# Patient Record
Sex: Female | Born: 1975 | Race: White | Hispanic: No | Marital: Single | Smoking: Former smoker
Health system: Southern US, Community
[De-identification: ages and names within clinical notes are randomized; demographics above are authoritative.]

## PROBLEM LIST (undated history)

## (undated) DIAGNOSIS — M542 Cervicalgia: Secondary | ICD-10-CM

## (undated) DIAGNOSIS — F32A Depression, unspecified: Secondary | ICD-10-CM

## (undated) DIAGNOSIS — E785 Hyperlipidemia, unspecified: Secondary | ICD-10-CM

## (undated) DIAGNOSIS — K269 Duodenal ulcer, unspecified as acute or chronic, without hemorrhage or perforation: Secondary | ICD-10-CM

## (undated) DIAGNOSIS — K922 Gastrointestinal hemorrhage, unspecified: Secondary | ICD-10-CM

## (undated) HISTORY — DX: Hyperlipidemia, unspecified: E78.5

## (undated) HISTORY — DX: Depression, unspecified: F32.A

## (undated) HISTORY — DX: Gastrointestinal hemorrhage, unspecified: K92.2

## (undated) HISTORY — PX: ABDOMINAL HYSTERECTOMY: SHX81

## (undated) HISTORY — PX: CHOLECYSTECTOMY: SHX55

## (undated) HISTORY — DX: Duodenal ulcer, unspecified as acute or chronic, without hemorrhage or perforation: K26.9

---

## 2004-09-15 ENCOUNTER — Ambulatory Visit: Payer: Self-pay | Admitting: Family Medicine

## 2004-11-13 ENCOUNTER — Inpatient Hospital Stay (HOSPITAL_COMMUNITY): Admission: AD | Admit: 2004-11-13 | Discharge: 2004-11-13 | Payer: Self-pay | Admitting: Obstetrics & Gynecology

## 2004-11-28 ENCOUNTER — Ambulatory Visit: Payer: Self-pay | Admitting: *Deleted

## 2004-12-18 ENCOUNTER — Emergency Department (HOSPITAL_COMMUNITY): Admission: EM | Admit: 2004-12-18 | Discharge: 2004-12-18 | Payer: Self-pay | Admitting: Emergency Medicine

## 2004-12-26 ENCOUNTER — Ambulatory Visit: Payer: Self-pay | Admitting: Obstetrics and Gynecology

## 2006-06-25 IMAGING — CT CT HEAD W/O CM
1 series · 16 of 30 positions shown, 20 images · IV contrast (agent unspecified)
Comparison: None.

CLINICAL DATA: Headaches since [REDACTED] night.  Brief loss of consciousness.   Back of head and neck hurts.
 HEAD CT WITHOUT CONTRAST:
TECHNIQUE: Contiguous axial CT images were obtained from the base of the skull through the vertex according to standard protocol without contrast.

[Series 2: brain w/o 4.8 h45s st · axial · non-contrast · 0.41mm/px · z∈[-163,-32]mm · 16 of 30 slices shown, 20 images]
[im 2/30  brain]
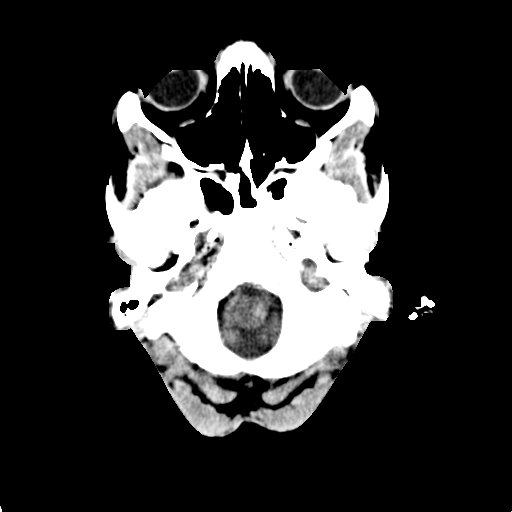
[im 2/30  bone]
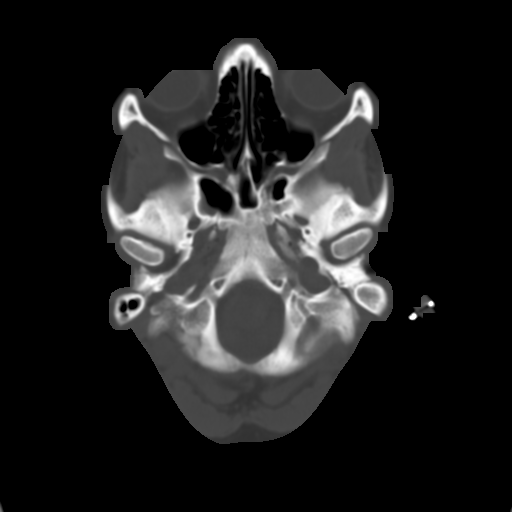
[im 4/30  brain]
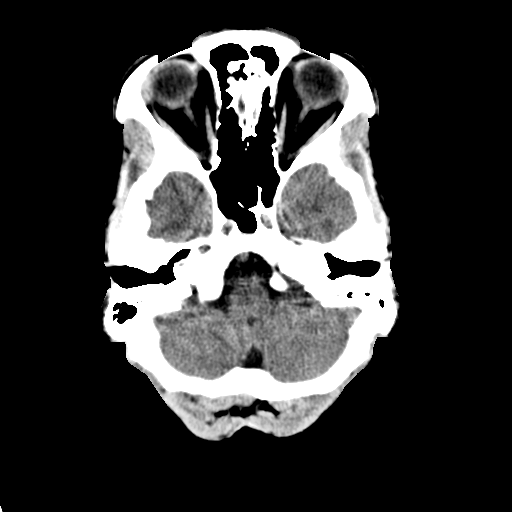
[im 6/30  brain]
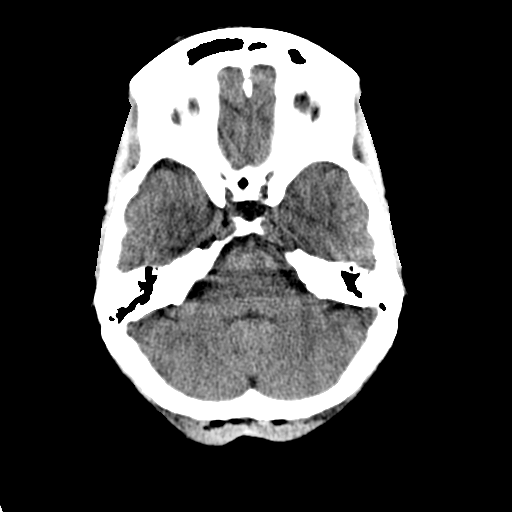
[im 8/30  brain]
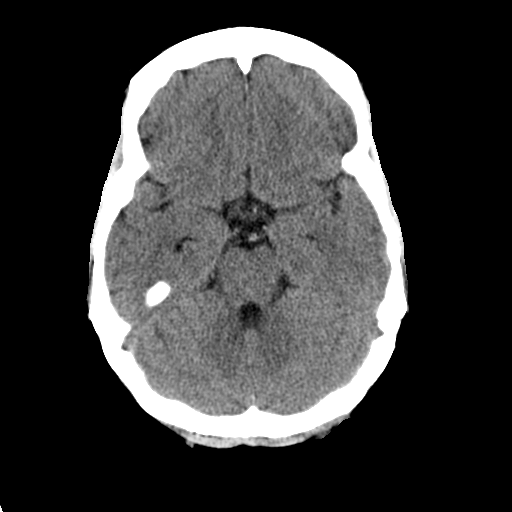
[im 9/30  brain]
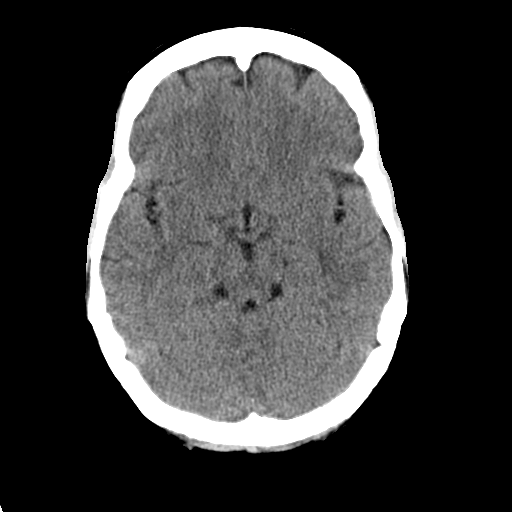
[im 9/30  bone]
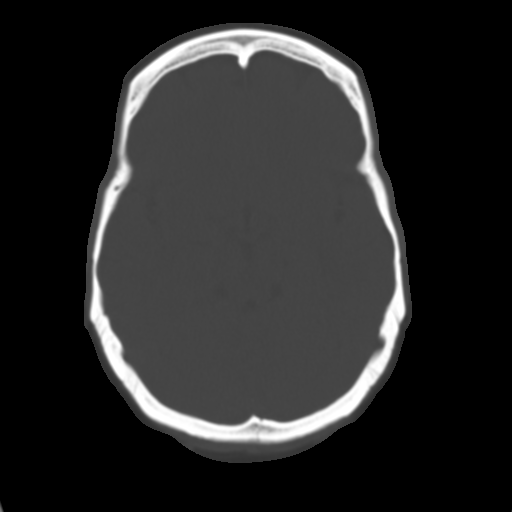
[im 11/30  brain]
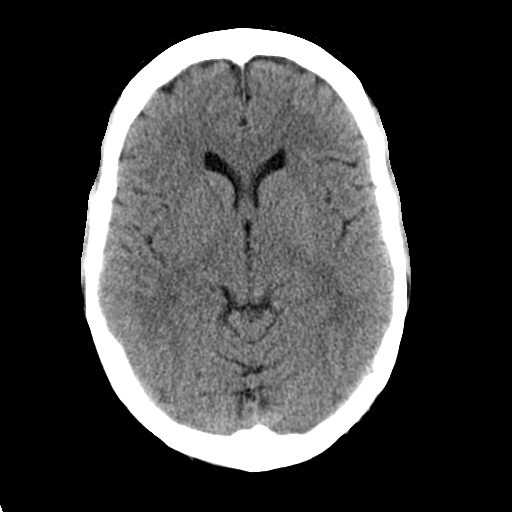
[im 13/30  brain]
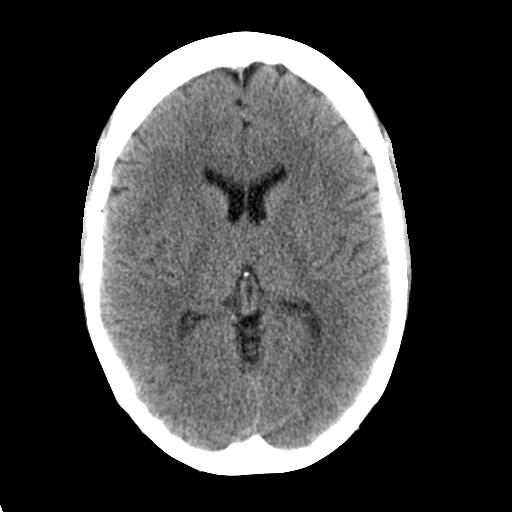
[im 15/30  brain]
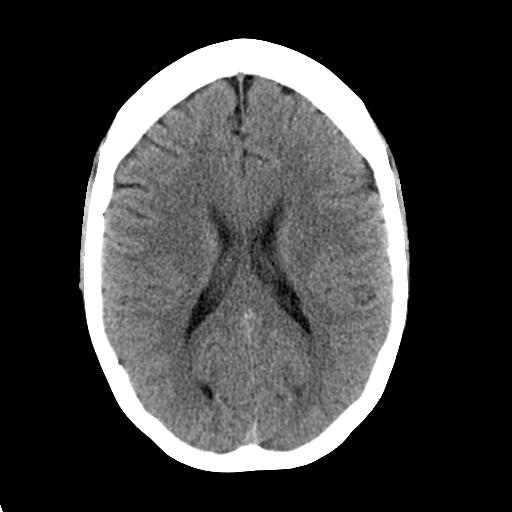
[im 16/30  brain]
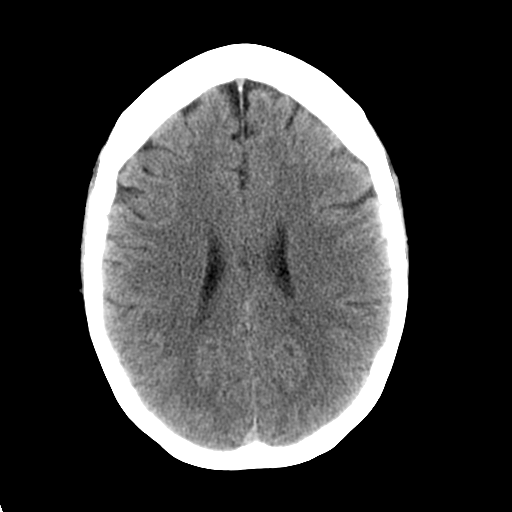
[im 16/30  bone]
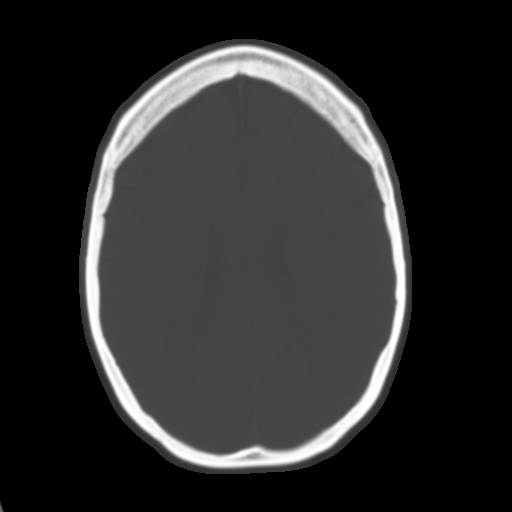
[im 18/30  brain]
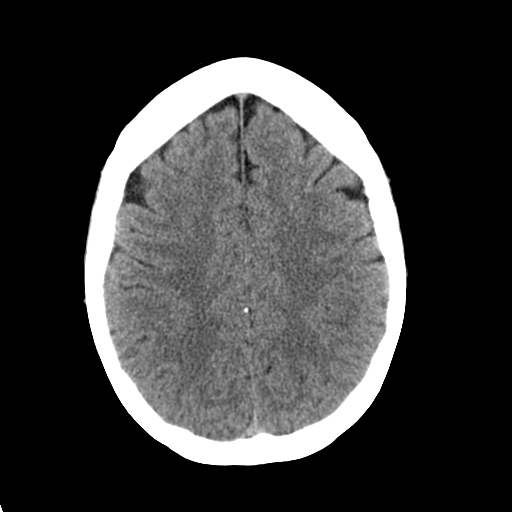
[im 20/30  brain]
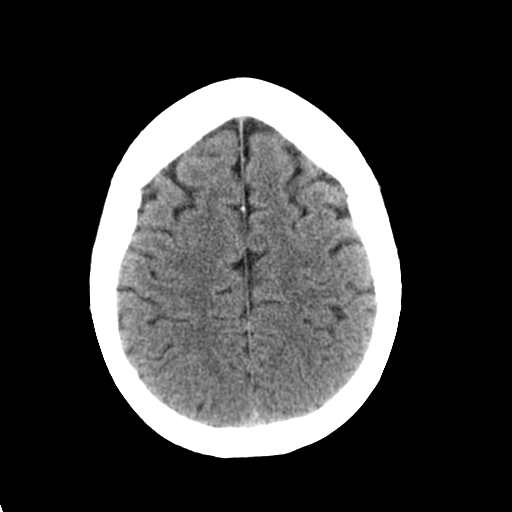
[im 22/30  brain]
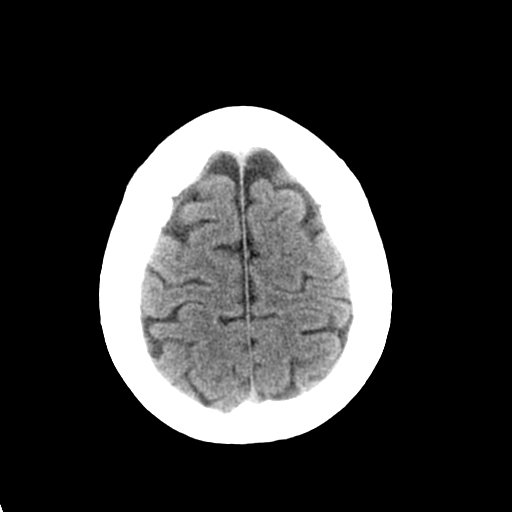
[im 23/30  brain]
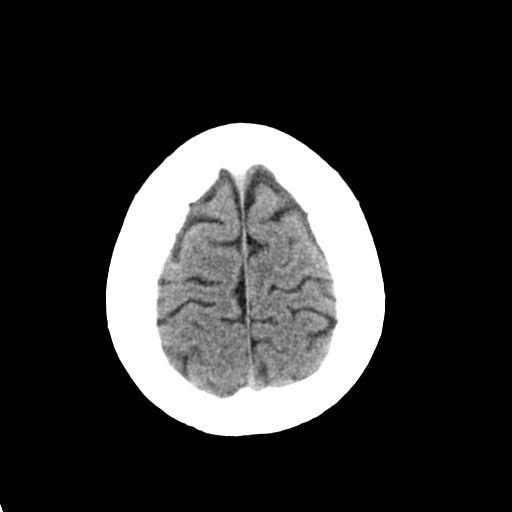
[im 23/30  bone]
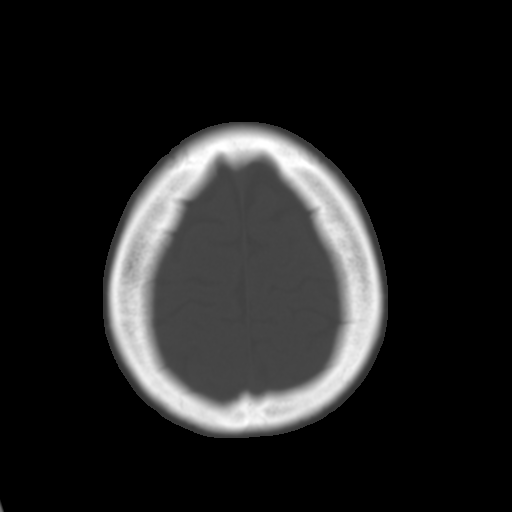
[im 25/30  brain]
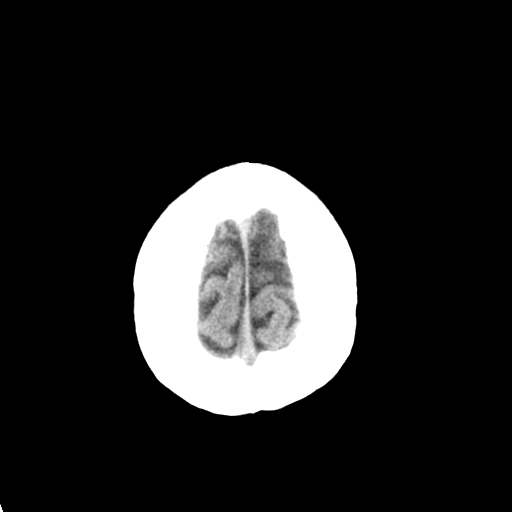
[im 27/30  brain]
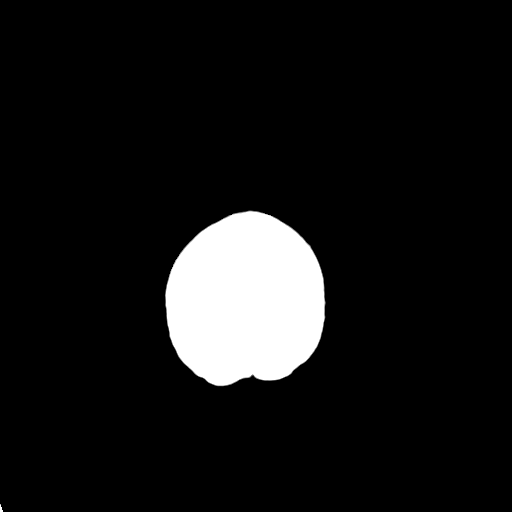
[im 29/30  brain]
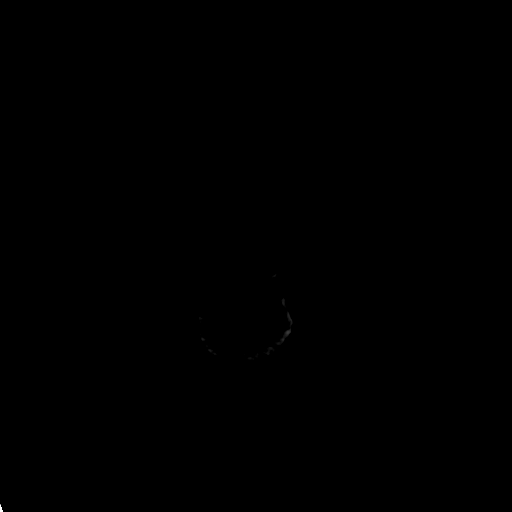

[16 of 30 positions shown; findings below may reference images not displayed]

FINDINGS: There is no evidence of intracranial hemorrhage, brain edema, or mass effect.  No other intra-axial abnormalities are seen, and the ventricles are within normal limits.  No abnormal extra-axial fluid collections or masses are identified.  No skull abnormalities are noted.
IMPRESSION: Negative non-contrast head CT.

## 2015-03-13 ENCOUNTER — Encounter (HOSPITAL_COMMUNITY): Payer: Self-pay | Admitting: Emergency Medicine

## 2015-03-13 ENCOUNTER — Emergency Department (HOSPITAL_COMMUNITY)
Admission: EM | Admit: 2015-03-13 | Discharge: 2015-03-13 | Disposition: A | Discharge status: Home / Self Care | Payer: BLUE CROSS/BLUE SHIELD | Attending: Emergency Medicine | Admitting: Emergency Medicine

## 2015-03-13 DIAGNOSIS — M545 Low back pain: Secondary | ICD-10-CM | POA: Diagnosis present

## 2015-03-13 DIAGNOSIS — Z79899 Other long term (current) drug therapy: Secondary | ICD-10-CM | POA: Insufficient documentation

## 2015-03-13 DIAGNOSIS — Z87891 Personal history of nicotine dependence: Secondary | ICD-10-CM | POA: Diagnosis not present

## 2015-03-13 DIAGNOSIS — Z880 Allergy status to penicillin: Secondary | ICD-10-CM | POA: Insufficient documentation

## 2015-03-13 DIAGNOSIS — M5441 Lumbago with sciatica, right side: Secondary | ICD-10-CM | POA: Insufficient documentation

## 2015-03-13 MED ORDER — TRAMADOL HCL 50 MG PO TABS
50.0000 mg | ORAL_TABLET | Freq: Four times a day (QID) | ORAL | Status: DC | PRN
Start: 2015-03-13 — End: 2022-07-14

## 2015-03-13 MED ORDER — HYDROMORPHONE HCL 1 MG/ML IJ SOLN
1.0000 mg | Freq: Once | INTRAMUSCULAR | Status: AC
  Administered 2015-03-13: 1 mg via INTRAMUSCULAR
  Filled 2015-03-13: qty 1

## 2015-03-13 MED ORDER — CYCLOBENZAPRINE HCL 10 MG PO TABS: 10.0000 mg | ORAL_TABLET | Freq: Two times a day (BID) | ORAL | Status: DC | PRN

## 2015-03-13 NOTE — Discharge Instructions (Signed)
Please read attached information. If you experience any new or worsening signs or symptoms please return to the emergency room for evaluation. Please follow-up with your primary care provider or specialist as discussed. Please use medication prescribed only as directed and discontinue taking if you have any concerning signs or symptoms.   °

## 2015-03-13 NOTE — ED Notes (Signed)
Per EMS, patient started having severe right hip, leg, and lower back pian around 11pm. Woke around 4 to void, pain intolerable. Hx of sciatic nerve pain, but on left side, not right side. Pt took a goody powder around 4 with no relief. VS 112/60, 98% on room air, 75 pulse, cbg 90. Patient tearful on arrival.

## 2015-03-13 NOTE — ED Provider Notes (Signed)
CSN: [id number]     Arrival date & time 03/13/15  0553 History   First MD Initiated Contact with Patient 03/13/15 [phone]     Chief Complaint  Patient presents with  . Hip Pain   HPI   40 year old female presents today with right back and hip pain. Patient reports symptoms started around 4 AM this morning with pain to the posterior aspect of her hip and lower back. She reports the pain originally was only minor, relieved with positioning of her foot and rotation of the lower extremity. She reports symptoms progressed with typical sciatic pain. She reports she's had sciatic pain to the left hip but never to the right. She reports the pain was shooting down her leg with 4 flexion and different movements of the hip. She reports she was unable to find a comfortable position, and BC powders at home did not improve symptoms. Patient denies any fever, chills, abdominal pain, nausea, vomiting, changes in the color clarity or characteristics of her urine, she denies any lower extremity swelling or edema, loss of distal sensation strength or motor function. Patient reports she's had difficulty ambulating due to the pain, but denies any specific loss of strength. Patient reports she's been seen by primary care for her sciatic symptoms of the left side, and has lost 30 pounds 2 help with lower back pain.  History reviewed. No pertinent past medical history. Past Surgical History  Procedure Laterality Date  . Abdominal hysterectomy      full hysterectomy  . Cholecystectomy     History reviewed. No pertinent family history. Social History  Substance Use Topics  . Smoking status: Former Smoker    Types: Cigarettes    Quit date: 03/12/2010  . Smokeless tobacco: None  . Alcohol Use: Yes     Comment: occasional drinker   OB History    No data available     Review of Systems  All other systems reviewed and are negative.  Allergies  Penicillins  Home Medications   Prior to Admission medications    Medication Sig Start Date End Date Taking? Authorizing Provider  Biotin 300 MCG TABS Take 300 mcg by mouth daily.   Yes Historical Provider, MD  Cholecalciferol (VITAMIN D3) 5000 units CAPS Take 5,000 Units by mouth daily.   Yes Historical Provider, MD  Cyanocobalamin (VITAMIN B-12 PO) Take 1 tablet by mouth daily.   Yes Historical Provider, MD  cyclobenzaprine (FLEXERIL) 10 MG tablet Take 1 tablet (10 mg total) by mouth 2 (two) times daily as needed for muscle spasms. 03/13/15   Eyvonne Mechanic, PA-C  traMADol (ULTRAM) 50 MG tablet Take 1 tablet (50 mg total) by mouth every 6 (six) hours as needed. 03/13/15   Virgilia Quigg, PA-C   BP 113/68 mmHg  Pulse 72  Resp 16  Ht 5\' 2"  (1.575 m)  Wt 85.73 kg  BMI 34.56 kg/m2  SpO2 99%   Physical Exam  Constitutional: She is oriented to person, place, and time. She appears well-developed and well-nourished. No distress.  HENT:  Head: Normocephalic and atraumatic.  Eyes: Conjunctivae are normal. Pupils are equal, round, and reactive to light. Right eye exhibits no discharge. Left eye exhibits no discharge. No scleral icterus.  Neck: Normal range of motion. Neck supple. No JVD present. No tracheal deviation present.  Pulmonary/Chest: Effort normal. No stridor.  Musculoskeletal: Normal range of motion. She exhibits tenderness. She exhibits no edema.  No C, T, or L spine tenderness to palpation. No obvious signs of  trauma, deformity, infection, step-offs. Lung expansion normal. No scoliosis or kyphosis. Bilateral lower extremity strength 5 out of 5, sensation grossly intact, patellar reflexes 2+, pedal pulses 2+, Refill less than 3 seconds.  Exquisite tenderness to palpation of the right lower lumbar soft tissue and posterior hip. Full active range of motion of the hip with pain to deep flexion, no warmth to touch.  Straight leg negative  Ambulates with    Neurological: She is alert and oriented to person, place, and time. Coordination normal.  Skin:  Skin is warm and dry. She is not diaphoretic.  Psychiatric: She has a normal mood and affect. Her behavior is normal. Judgment and thought content normal.  Nursing note and vitals reviewed.     ED Course  Procedures (including critical care time) Labs Review Labs Reviewed - No data to display  Imaging Review No results found. I have personally reviewed and evaluated these images and lab results as part of my medical decision-making.   EKG Interpretation None      MDM   Final diagnoses:  Right-sided low back pain with right-sided sciatica   Labs:   Imaging:  Consults:  Therapeutics: Dilaudid  Discharge Meds: Tramadol, Flexeril  Assessment/Plan: 40-year-old female presents today with likely musculoskeletal back and hip pain. Patient has a history of the same on the left, has had increased activity recently, with no red flags. She has no neurological deficits, or any other concerning signs or symptoms that would necessitate further evaluation and management here in the evening. Patient pain was managed here in the ED, patient was discharged home with pain medication and instructions follow-up with a primary care provider in one week.          Eyvonne MechanicJeffrey Talia Hoheisel, PA-C 03/13/15 1422  Tomasita CrumbleAdeleke Oni, MD 03/13/15 [phone]

## 2016-03-02 ENCOUNTER — Encounter (HOSPITAL_COMMUNITY): Payer: Self-pay | Admitting: *Deleted

## 2016-03-02 ENCOUNTER — Emergency Department (HOSPITAL_COMMUNITY)
Admission: EM | Admit: 2016-03-02 | Discharge: 2016-03-02 | Disposition: A | Discharge status: Home / Self Care | Payer: BLUE CROSS/BLUE SHIELD | Attending: Emergency Medicine | Admitting: Emergency Medicine

## 2016-03-02 ENCOUNTER — Emergency Department (HOSPITAL_COMMUNITY): Payer: BLUE CROSS/BLUE SHIELD

## 2016-03-02 DIAGNOSIS — Z87891 Personal history of nicotine dependence: Secondary | ICD-10-CM | POA: Diagnosis not present

## 2016-03-02 DIAGNOSIS — N200 Calculus of kidney: Secondary | ICD-10-CM | POA: Diagnosis not present

## 2016-03-02 DIAGNOSIS — R109 Unspecified abdominal pain: Secondary | ICD-10-CM | POA: Diagnosis present

## 2016-03-02 LAB — URINALYSIS, ROUTINE W REFLEX MICROSCOPIC
Bilirubin Urine: NEGATIVE
Glucose, UA: NEGATIVE mg/dL
Ketones, ur: NEGATIVE mg/dL
Nitrite: NEGATIVE
Protein, ur: NEGATIVE mg/dL
Specific Gravity, Urine: 1.006 (ref 1.005–1.030)
pH: 6 (ref 5.0–8.0)

## 2016-03-02 LAB — LIPASE, BLOOD: Lipase: 17 U/L (ref 11–51)

## 2016-03-02 LAB — CBC WITH DIFFERENTIAL/PLATELET
Basophils Absolute: 0 10*3/uL (ref 0.0–0.1)
Basophils Relative: 0 %
Eosinophils Absolute: 0.1 10*3/uL (ref 0.0–0.7)
Eosinophils Relative: 2 %
HCT: 39.5 % (ref 36.0–46.0)
Hemoglobin: 13.2 g/dL (ref 12.0–15.0)
Lymphocytes Relative: 31 %
Lymphs Abs: 2.2 10*3/uL (ref 0.7–4.0)
MCH: 30.1 pg (ref 26.0–34.0)
MCHC: 33.4 g/dL (ref 30.0–36.0)
MCV: 90.2 fL (ref 78.0–100.0)
Monocytes Absolute: 0.4 10*3/uL (ref 0.1–1.0)
Monocytes Relative: 5 %
Neutro Abs: 4.5 10*3/uL (ref 1.7–7.7)
Neutrophils Relative %: 62 %
Platelets: 277 10*3/uL (ref 150–400)
RBC: 4.38 MIL/uL (ref 3.87–5.11)
RDW: 13.5 % (ref 11.5–15.5)
WBC: 7.2 10*3/uL (ref 4.0–10.5)

## 2016-03-02 LAB — COMPREHENSIVE METABOLIC PANEL
ALT: 19 U/L (ref 14–54)
AST: 17 U/L (ref 15–41)
Albumin: 3.9 g/dL (ref 3.5–5.0)
Alkaline Phosphatase: 69 U/L (ref 38–126)
Anion gap: 10 (ref 5–15)
BUN: 15 mg/dL (ref 6–20)
CO2: 21 mmol/L — ABNORMAL LOW (ref 22–32)
Calcium: 9.3 mg/dL (ref 8.9–10.3)
Chloride: 107 mmol/L (ref 101–111)
Creatinine, Ser: 0.94 mg/dL (ref 0.44–1.00)
GFR calc Af Amer: >60 mL/min (ref >60)
GFR calc non Af Amer: >60 mL/min (ref >60)
Glucose, Bld: 85 mg/dL (ref 65–99)
Potassium: 4.1 mmol/L (ref 3.5–5.1)
Sodium: 138 mmol/L (ref 135–145)
Total Bilirubin: 0.7 mg/dL (ref 0.3–1.2)
Total Protein: 6.8 g/dL (ref 6.5–8.1)

## 2016-03-02 MED ORDER — KETOROLAC TROMETHAMINE 30 MG/ML IJ SOLN
30.0000 mg | Freq: Once | INTRAMUSCULAR | Status: AC
  Administered 2016-03-02: 30 mg via INTRAVENOUS
  Filled 2016-03-02: qty 1

## 2016-03-02 MED ORDER — ONDANSETRON HCL 4 MG/2ML IJ SOLN
4.0000 mg | Freq: Once | INTRAMUSCULAR | Status: AC
  Administered 2016-03-02: 4 mg via INTRAVENOUS
  Filled 2016-03-02: qty 2

## 2016-03-02 MED ORDER — SODIUM CHLORIDE 0.9 % IV SOLN
Freq: Once | INTRAVENOUS | Status: AC
  Administered 2016-03-02: 11:00:00 via INTRAVENOUS

## 2016-03-02 NOTE — ED Triage Notes (Signed)
To ED for eval of llq pain since this am. States the pain is sharp and after it started she had 1 episode of diarrhea. No vomiting. No fever. No pain with urination

## 2016-03-02 NOTE — ED Provider Notes (Signed)
[facility]-EMERGENCY DEPT Provider Note   CSN: [id number] Arrival date & time: 03/02/16  1006     History   Chief Complaint Chief Complaint  Patient presents with  . Abdominal Pain    HPI Megan Melton is a 40 y.o. female.  HPI Megan Melton is a 40 y.o. female with history of full abdominal hysterectomy and cholecystectomy, presents to emergency department with sudden onset of sharp left-sided abdominal pain. Patient states pain started this morning when she was sitting at her desk, states approximately hour and a half ago. She describes initial pain is sharp, shooting from the flank into the left lower abdomen. She states she felt full and bloated at the same time. She thought it was gas pain and needed to have a bowel movement. She reports associated nausea and diaphoresis. She states when she went to the bathroom she had small diarrhea which is not unusual for her. She reports no pain relief after the bowel movement. She states at this time the sharp pain has resolved, but continues to have dull pain in the left lower abdomen only radiating into "my private area." Denies any urinary symptoms. Did not take any medications prior to coming in.  History reviewed. No pertinent past medical history.  There are no active problems to display for this patient.   Past Surgical History:  Procedure Laterality Date  . ABDOMINAL HYSTERECTOMY     full hysterectomy  . CHOLECYSTECTOMY      OB History    No data available       Home Medications    Prior to Admission medications   Medication Sig Start Date End Date Taking? Authorizing Provider  Biotin 300 MCG TABS Take 300 mcg by mouth daily.    Historical Provider, MD  Cholecalciferol (VITAMIN D3) 5000 units CAPS Take 5,000 Units by mouth daily.    Historical Provider, MD  Cyanocobalamin (VITAMIN B-12 PO) Take 1 tablet by mouth daily.    Historical Provider, MD  cyclobenzaprine (FLEXERIL) 10 MG tablet Take 1 tablet (10 mg total) by mouth 2  (two) times daily as needed for muscle spasms. 03/13/15   Eyvonne MechanicJeffrey Hedges, PA-C  traMADol (ULTRAM) 50 MG tablet Take 1 tablet (50 mg total) by mouth every 6 (six) hours as needed. 03/13/15   Eyvonne MechanicJeffrey Hedges, PA-C    Family History No family history on file.  Social History Social History  Substance Use Topics  . Smoking status: Former Smoker    Types: Cigarettes    Quit date: 03/12/2010  . Smokeless tobacco: Not on file  . Alcohol use Yes     Comment: occasional drinker     Allergies   Penicillins   Review of Systems Review of Systems  Constitutional: Negative for chills and fever.  Respiratory: Negative for cough, chest tightness and shortness of breath.   Cardiovascular: Negative for chest pain, palpitations and leg swelling.  Gastrointestinal: Positive for abdominal pain and nausea. Negative for diarrhea and vomiting.  Genitourinary: Negative for dysuria, flank pain and pelvic pain.  Musculoskeletal: Negative for arthralgias, myalgias, neck pain and neck stiffness.  Skin: Negative for rash.  Neurological: Negative for dizziness, weakness and headaches.  All other systems reviewed and are negative.    Physical Exam Updated Vital Signs BP 144/83 (BP Location: Left Arm)   Pulse 66   Temp 97.4 F (36.3 C) (Oral)   Ht 5\' 2"  (1.575 m)   Wt 86.2 kg   SpO2 99%   BMI 34.75 kg/m  Physical Exam  Constitutional: She is oriented to person, place, and time. She appears well-developed and well-nourished. No distress.  HENT:  Head: Normocephalic.  Eyes: Conjunctivae are normal.  Neck: Neck supple.  Cardiovascular: Normal rate, regular rhythm and normal heart sounds.   Pulmonary/Chest: Effort normal and breath sounds normal. No respiratory distress. She has no wheezes. She has no rales.  Abdominal: Soft. Bowel sounds are normal. She exhibits no distension. There is tenderness. There is no rebound.  Musculoskeletal: She exhibits no edema.  Neurological: She is alert and oriented  to person, place, and time.  Skin: Skin is warm and dry.  Psychiatric: She has a normal mood and affect. Her behavior is normal.  Nursing note and vitals reviewed.    ED Treatments / Results  Labs (all labs ordered are listed, but only abnormal results are displayed) Labs Reviewed  COMPREHENSIVE METABOLIC PANEL - Abnormal; Notable for the following:       Result Value   CO2 21 (*)    All other components within normal limits  URINALYSIS, ROUTINE W REFLEX MICROSCOPIC - Abnormal; Notable for the following:    Color, Urine STRAW (*)    Hgb urine dipstick SMALL (*)    Leukocytes, UA SMALL (*)    Bacteria, UA RARE (*)    Squamous Epithelial / LPF 0-5 (*)    All other components within normal limits  CBC WITH DIFFERENTIAL/PLATELET  LIPASE, BLOOD    EKG  EKG Interpretation None       Radiology Ct Renal Stone Study  Result Date: 03/02/2016 CLINICAL DATA:  Left flank pain radiating to the left lower quadrant this morning. EXAM: CT ABDOMEN AND PELVIS WITHOUT CONTRAST TECHNIQUE: Multidetector CT imaging of the abdomen and pelvis was performed following the standard protocol without IV contrast. COMPARISON:  None. FINDINGS: Lower chest: No acute abnormality. Hepatobiliary: No focal liver abnormality is seen. Status post cholecystectomy. No biliary dilatation. Pancreas: Unremarkable. No pancreatic ductal dilatation or surrounding inflammatory changes. Spleen: Normal in size without focal abnormality. Adrenals/Urinary Tract: The adrenal glands are normal. There is a 1 mm nonobstructing stone in the lateral midpole right kidney. Bilateral parapelvic renal cysts are identified. There is no hydronephrosis bilaterally. There is a 2 mm stone in the posterior bladder lumen. The bladder is otherwise normal. Stomach/Bowel: Stomach is within normal limits. Appendix appears normal. No evidence of bowel wall thickening, distention, or inflammatory changes. Vascular/Lymphatic: No significant vascular  findings are present. No enlarged abdominal or pelvic lymph nodes. Reproductive: Status post hysterectomy. No adnexal masses. Other: There is minimal umbilical herniation of mesenteric fat. Musculoskeletal: No acute or significant osseous findings. IMPRESSION: A 2 mm stone in the posterior bladder lumen. This could represent a recently passed stone. There is no hydronephrosis bilaterally. Small nonobstructing stone is identified in the right kidney. Electronically Signed   By: Sherian Rein M.D.   On: 03/02/2016 11:31    Procedures Procedures (including critical care time)  Medications Ordered in ED Medications  ondansetron (ZOFRAN) injection 4 mg (4 mg Intravenous Given 03/02/16 1100)  ketorolac (TORADOL) 30 MG/ML injection 30 mg (30 mg Intravenous Given 03/02/16 1101)  0.9 %  sodium chloride infusion ( Intravenous Stopped 03/02/16 1249)     Initial Impression / Assessment and Plan / ED Course  I have reviewed the triage vital signs and the nursing notes.  Pertinent labs & imaging results that were available during my care of the patient were reviewed by me and considered in my medical decision making (  see chart for details).  Clinical Course    Patient with left lower quadrant abdominal pain shooting into the flank and into the vaginal area onset suddenly while at work aren't half ago. Highly suspicious for kidney stone, although I cannot rule out diverticulitis or colitis given an episode of diarrhea. We will check labs, will get CT abdomen and pelvis. Patient is very anxious, states that her sister passed away from what they thought was pancreatitis but states she ended up having an MI. Patient denies any chest pain or shortness of breath at this time. Patient reassured.  Labs normal. Patient's CT scan shows 2 mm stone in the bladder. Consistent with a passed stone. Patient feels much better. Discussed results and plan of discharge home and constipation follow-up. Return precautions  discussed. Patient is comfortable with the plan, voiced understanding, all questions answered.  Vitals:   03/02/16 1015 03/02/16 1230  BP: 144/83 116/79  Pulse: 66 66  Resp:  16  Temp: 97.4 F (36.3 C)   TempSrc: Oral   SpO2: 99% 100%  Weight: 86.2 kg   Height:  (1.575 m)      Final Clinical Impressions(s) / ED Diagnoses   Final diagnoses:  Kidney stone    New Prescriptions Discharge Medication List as of 03/02/2016 12:34 PM       Jaynie Crumble, PA-C 03/02/16 1533    Maia Plan, MD 03/02/16 1842

## 2016-03-02 NOTE — Discharge Instructions (Signed)
Take ibuprofen or tylenol for pain. Your lab work is normal today. Your CT scan showed that you passed a 7[place]6Romeo Marye R32w0[facility] Thedore KoreaMMemphis Eye And Cataract Ambulatory Surgery CenteriKoreanRomKentuckyeoCathren HaRegency Hospital Of Toledor4782767w18(709)36w61KeRedg440[place]53DAvera Creighton HoMaryl51045KentuckyKentucky57923KentuckymRomeo Marye R32w0HiLLCrest HospThedore KoreaMSt. Luke'S Wood River Medical CenteriKoreanRomKentuckyeoCathren HaRanken Jordan A Pediatric Rehabilitation Centerr4782723w1852062Redg785Kentucky19DLourdes Counseling CentMaryl21045KentuckyKentucky34939KentuckymRomeo Marye R81w0Blue Bonnet SurgerThedore KoreaMPali Momi Medical CenteriKoreanRomKentuckyeoCathren HaRincon Medical Centerr4782758Redg813Kentucky72DMid Florida Endoscopy And SurgeMaryl70045KentuckyKentucky65926KentuckymRomeo Marye R20w0[place] SurgicaThedore KoreaMMetropolitan St. Louis Psychiatric CenteriKoreanRomKentuckyeoCathren HaBaptist Eastpoint Surgery Center LLCr4782773w18(26772w61KeRedg(361Kentucky32DSouth Florida State HospitMaryl16045KentuckyKentucky1194KentuckymRomeo Marye R25w0Girard MediThedore KoreaMEisenhower Army Medical CenteriKoreanRomKentuckyeoCathren HaMilford Valley Memorial Hospitalr478276Redg254Kentucky66DCenterstoneMaryl71045KentuckyKentucky38929KentuckymRomeo Marye R23w0Barnes-Kasson CountThedore KoreaMProvidence Medford Medical CenteriKoreanRomKentuckyeoCathren HaParkview Community Hospital Medical Centerr47827Redg743Kentucky38DEndoscopy Center Of BuckMaryl72045KentuckyKentucky37988KentuckymRomeo Marye R39w0Sanford HospitThedore KoreaMFolsom Outpatient Surgery Center LP Dba Folsom Surgery CenteriKoreanRomKentuckyeoCathren HaAllen County Regional Hospitalr4782Redg95Kentucky46DEastern NiagMaryl19045KentuckyKentucky44934KentuckymRomeo Marye R71w0Blair Endoscopy Thedore KoreaMProvidence St. Peter HospitaliKoreanRomKentuckyeoCathren HaHershey Endoscopy Center LLCr4782781w1Redg941Kentucky53DSurgical Associates EndoscopyMaryl8104KentuckyKentucky5991KentuckymRomeo Marye R55w0St. Vincent'Thedore KoreaMKu Medwest Ambulatory Surgery Center LLCiKoreanRomKentuckyeoCathren HaSpine And Sports Surgical Center LLCRedg223Kentucky68DVa N California HealMaryl39045KentuckyKentucky5898KentuckymRomeo Marye R68w0Windom AreThedore KoreaMNorth Valley Health CenteriKoreanRomKentuckyeoCathren HaAdventhealth Maryl10977924KentuckymRomeo Marye R65w0Nacogdoches MemoriaThedore KoreaMWyoming Endoscopy CenteriKoreanRomKentuckyeoCathren HaMclaren Bay Regionalr4782749w18Redg(984)[place]ucky24DBurbank Spine And Pain SurgeryMaryl38045KentuckyKentucky46960KentuckymRomeo Marye R85w0Weimar MediThedore KoreaMPalestine Laser And Surgery CenteriKoreanRomKentuckyeoCathren HaSt Dominic Ambulatory Surgery Centerr4782782wRedg(817Kentucky14DGreat Plains Regional MedicalMaryl66045KentuckyKentucky60937KentuckymRomeo Marye R61w0Ottawa County HeaThedore KoreaMMain Street Asc LLCiKoreanRomKentuckyeoCathren HaBanner - University Medical Center Phoenix Campusr478Redg8Kentucky75DSwedish Medical Center - BallaMaryl45045KentuckyKentucky60961KentuckymRomeo Marye R64Thedore KoreaMAltru Specialty HospitaKentuckyliCathren HaMemorial Hospitalr473576931KentuckyZOXWChKentucKorea20UUVO3674Shadelands Advanced EndPatria MKentuc502Kentucky55DTri City Surgery Center LLConLuGretcFalkland Islands 19(MSynetta FA0KentuckyKentucky436KentuckymRomeo Marye R9w0T Surgery Thedore KoreaMAllegheny Clinic Dba Ahn Westmoreland Endoscopy CenteriKoreanRomKentuckyeoCathren HaMemorial Hermann Surgery Center Pinecroftr4782731w1Redg718Kentucky57DAnmed Health RehabilitaMaryl69045KentuckyKentucky39948KentuckymRomeo Marye R36w0Prisma Health HiLLCresThedore KoreaMAmbulatory Endoscopic Surgical Center Of [place]ucks [place] LLCiKoreanRomKentuckyeoCathren HaAnimas Surgical Hospital, LLCr4782742w18(97034w61KentuckyZOXWChKentucKoreaRedg269[place]entucky30DWest Springs HMaryl45045KentuckyKentucky93977KentuckymRomeo Marye R5w0River Valley MediThedore KoreaMSt Joseph HospitaliKoreanRomKentuckyeoCathren HaSouthern Tennessee Regional Health System Lawrenceburgr4782716w188Redg332Kentucky16DNorth Arkansas Regional Medical Centerone7AcaMaryl70045KentuckyKentucky47974KentuckymRomeo Marye R78w0Institute Of Orthopaedic SThedore KoreaMMercy HospitaliKoreanRomKentuckyeoCathren HaOak And Main Surgicenter LLCrRedg2Kentucky23DSutter Amador SurgMaryl10004KentuckyKentucky52947KentuckymRomeo Marye R28w0George RegionaThedore KoreaMCompass Behavioral Center Of AlexandriaiKoreanRomKentuckyeoCathren HaFair Park Surgery Centerr478272w1Redg574Kentucky63DIron County Hospitalone8GastrMaryl200451KentuckyKentucky06938KentuckymRomeo Marye R48w0Mid PeninsulaThedore KoreaMWallowa Memorial HospitaliKoreanRomKentuckyeoCathren HaCenter For Ambulatory And Minimally Invasive Surgery LLCr4782761wRedg865Kentucky85DSaint ALPhonsus Medical Center - OntarioMaryl82045KentuckyKentucky50936KentuckymRomeo Marye R55w0Newport BaThedore KoreaMRimrock FoundationiKoreanRomKentuckyeoCathren HaCoastal Surgery Center LLCr478278Redg779Kentucky75DTristar Skyline MedicalMaryl19045KentuckyKentucky65940KentuckymRomeo Marye R107w0Memorial HospiThedore KoreaMMankato Clinic Endoscopy Center LLCiKoreanRomKentuckyeoCathren HaSouthern Endoscopy Suite LLCr4782725w1Redg941Kentucky71DCaribbean Medical Centerone(33ContinuMaryl6045KentuckyKentucky12964KentuckymRomeo Marye R29w0Saint VincenThedore KoreaMCheyenne Surgical Center LLCiKoreanRomKentuckyeoCathren HaVa Medical Center - Bataviar47Redg(364Kentucky21DUcsf Medical CenteMaryl38045KentuckyKentucky74922KentuckymRomeo Marye R24w0Wake Forest Joint VeThedore KoreaMJenkins County HospitaliKoreanRomKentuckyeoCathren HaHind General Hospital LLCr4782763w18638w61KeRedg(316)[place]ucky27DPinnacle Regional HoMaryl22045KentuckyKentucky59957KentuckymRomeo Marye R54w0Franklin MemoriaThedore KoreaMUpmc LititziKoreanRomKentuckyeoCathren HaLegacy Mount Hood Medical Redg276Kentucky68DGso Equipment Corp Dba The Oregon Clinic EndoscoMaryl30045KentuckyKentucky40972KentuckymRomeo Marye R48w0SycamoThedore KoreaMIndiana University Health Ball Memorial HospitaliKoreanRomKentuckyeoCathren HaLutheran Medical Centerr478Redg2Kentucky52DBarnet Dulaney Perkins Eye Center Safford SMaryl71045KentuckyKentucky65924KentuckymRomeo Marye R61w0Atrium HealtThedore KoreaMHarmony Surgery Center LLCiKoreanRomKentuckyeoCathren HaUpmc St Margaretr4782764w1822075wRedg938Kentucky85DSamaritan Albany General Hospitalone3Cleveland CliniMaryl40045KentuckyKentucky56925KentuckymRomeo Marye R1w0Bayfront Ambulatory Surgical Thedore KoreaMKindred Hospital DetroitiKoreanRomKentuckyeoCathren HaLouisville Surgery Centerr4782714w18(32Redg223KentuckyMaryl3204KentuckyKentucky58944KentuckymRomeo Marye R62w0Morton PlanThedore KoreaMFreeman Surgical Center LLCiKoreanRomKentuckyeoCathren HaTower Outpatient Surgery Center Inc Dba Tower Outpatient Surgey Centerr4782Redg972Kentucky21DCentury HospitalMaryl6045KentuckyKentucky49956KentuckymRomeo Marye R28w0Kings County HospiThedore KoreaMNorth River Surgical Center LLCiKoreanRomKentuckyeoCathren HaAnne Arundel Surgery Center Pasadenar4782723w1Redg(931[place]entucky54DConnally Memorial Medical CMaryl29045KentuckyKentucky16972KentuckymRomeo Marye R36w0Va Loma Linda HealthcThedore KoreaMFaulkton Area Medical CenteriKoreanRomKentuckyeoCathren HaBanner Phoenix Surgery Center LLCr47827Redg(639)[place]44DViewpoint AsseMaryl32045KentuckyKentucky58945KentuckymRomeo Marye R84w0Plantation GeneraThedore KoreaMBay Pines Va Medical CenteriKoreanRomKentuckyeoCathren HaMerit Health Natchezr4782721w18201Redg(360Kentucky17DSelect Specialty Hospital - Ann Maryl40045KentuckyKentucky30952KentuckymRomeo Marye R81w0[place]. MediThedore KoreaMEncompass Health Rehabilitation Hospital Of DallasiKoreanRomKentuckyeoCathren HaWiregrass Medical Centerr4782Redg562Kentucky43DCentracare HealMaryl72045KentuckyKentucky48952KentuckymRomeo Marye R77w0Midwest Surgery Thedore KoreaMThe Hospitals Of Providence Memorial CampusiKoreanRomKentuckyeoCathren HaRed Cedar Surgery Center PLLCr4782756Redg(425Kentucky69DPennsylvania Eye Surgery Center Incone(2OMaryl56045KentuckyKentucky75959KentuckymRomeo Marye R62w0Surgical SpecialisThedore KoreaMSagamore Surgical Services InciKoreanRomKentuckyeoCathren HaGreat River Medical CenterRedg669Kentucky47DBluegrass Orthopaedics Surgical Division LLCMaryl23045KentuckyKentucky71928KentuckymRomeo Marye R24w0Medical City WThedore KoreaMSurgery Center InciKoreanRomKentuckyeoCathren HaIu Health University Hospitalr478277w1863Redg(712)KentuMaryl7511969KentuckymRomeo Marye R71w0Adventist Healthcare Behavioral Health Thedore KoreaMBethel Park Surgery CenteriKoreanRomKentuckyeoCathren HaThe Urology Center Pcr4782721w1841516w61KentuRedg786Kentucky81DReevesMaryl72045KentuckyKentucky33951KentuckymRomeo Marye R74w0Focus Hand SurgiThedore KoreaMTexas Health Surgery Center Bedford LLC Dba Texas Health Surgery Center BedfordiKoreanRomKentuckyeoCathren HaAlta Rose Surgery CenterRedg(951)[place]71DMercy Hospital Oklahoma City OuMaryl31045KentuckyKentucky469100KentuckymRomeo Marye R67w0Mt Airy Ambulatory Endoscopy SurgThedore KoreaMOakes Community HospitaliKoreanRomKentuckyeoCathren HaHealthsouth Rehabilitation Hospital Of Northern Virginiar4782756w18(709)72w61KentRedg631Kentucky15DGundersen St Josephs Hlth Svcsone3Ridgecrest Regional HoMaryl35045KentuckyKentucky67950KentuckymRomeo Marye R7w0Aurora Sheboygan MThedore KoreaMSyosset HospitaliKoreanRomKentuckyeoCathren HaPalm Beach Gardens Medical Centerr4782768w188651Redg903Kentucky67DEncompass Health Rehabilitation HospiMaryl74045KentuckyKentucky64931KentuckymRomeo Marye R70w0Mclaren GreatThedore KoreaMLower Bucks HospitaliKoreanRomKentuckyeoCathren HaPeak View Behavioral Healthr4782731w18(44527w61Redg(867)[place]83DKarmanos Cancer Centerone(2Melrosewkfld HeaMaryl17045KentuckyKentucky32928KentuckymRomeo Marye R23w0Prisma Health Greenville MemoriaThedore KoreaMWalter Olin Moss Regional Medical CenteriKoreanRomKentuckyeoCathren HaMission Endoscopy Center Redg938Kentucky70DEast Tennessee Ambulatory Surgery Centerone5Ssm Maryl39045KentuckyKentucky63966KentuckymRomeo Marye R17w0Christus Spohn HospThedore KoreaMBayfront Health Punta GordaiKoreanRomKentuckyeoCathren HaBergen Regional Medical Centerr478Redg607Kentucky79DClinton HosMaryl82045KentuckyKentucky66928KentuckymRomeo Marye R7w0Medical Center Of AThedore KoreaMPalo Alto Va Medical CenteriKoreanRomKentuckyeoCathren HaKula Hospitalr4782Redg938Kentucky69DNovant Health Prespyterian Medical Centerone(2Maryl67045KentuckyKentucky50989KentuckymRomeo Marye R27w0Montgomery County Mental Health TreatmenThedore KoreaMAnthony M Yelencsics CommunityiKoreanRomKentuckyeoCathren HaBoston Children'Sr4782752w18Redg918Kentucky33DScottsdale Eye Surgery Maryl4045KentuckyKentucky75934KentuckymRomeo Marye R62w0PlainvieThedore KoreaMChilton Memorial HospitaliKoreanRomKentuckyeoCathren HaGeorge Washington University Hospitalr478Redg6Kentucky15DNorthern New Jersey Center For Advanced Endoscopy LLConSeleMaryl77045KentuckyKentucky66923KentuckymRomeo Marye R48w0[place] MemoriaThedore KoreaMPsychiatric Institute Of WashingtoniKoreanRomKentuckyeoCathren HaMt Edgecumbe Hospital - Searhcr478271Redg(650Kentucky85DCharlotte Surgery Center LLC Dba Charlotte Surgery Center Museum Campusone5NeMaryl31045KentuckyKentucky73953KentuckymRomeo Marye R79w0Bedford MemoriaThedore KoreaMMercy Hospital JopliniKoreanRomKentuckyeoCathren HaLifescaper4782729Redg4Kentucky71DOrthopaedic Institute Surgery Maryl91045KentuckyKentucky2795284oyola Mastte Ashland Community HoLucZOFalkland Islands 68(MSynetta Fai1615AuDuwayne H urn if worsening symptoms.

## 2017-09-07 IMAGING — CT CT RENAL STONE PROTOCOL
2 of 4 series · 16 of 46 positions shown, 18 images · non-contrast
Comparison: None.

CLINICAL DATA: Left flank pain radiating to the left lower quadrant
this morning.

EXAM:
CT ABDOMEN AND PELVIS WITHOUT CONTRAST
TECHNIQUE: Multidetector CT imaging of the abdomen and pelvis was performed
following the standard protocol without IV contrast.

[Series 2: stone study 5.0 i30f 1 · axial · 0.74mm/px · z∈[+768,+1233]mm · 13 of 103 slices shown, 15 images]
[im 5/103  soft-tissue]
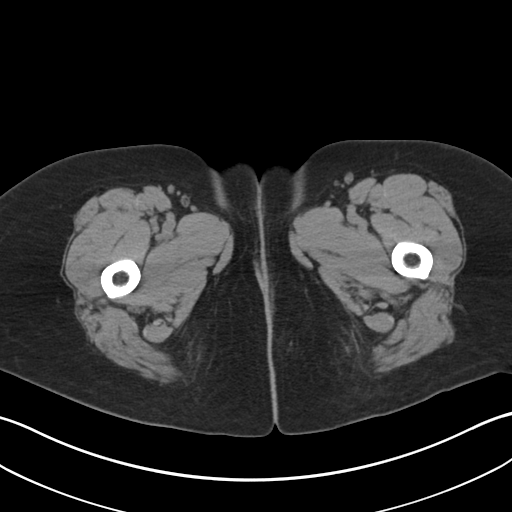
[im 5/103  bone]
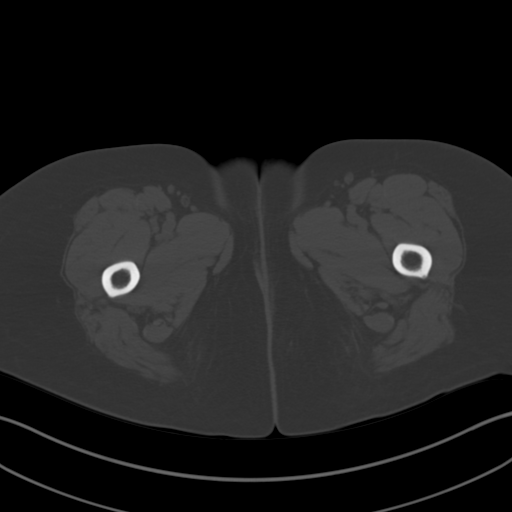
[im 14/103  soft-tissue]
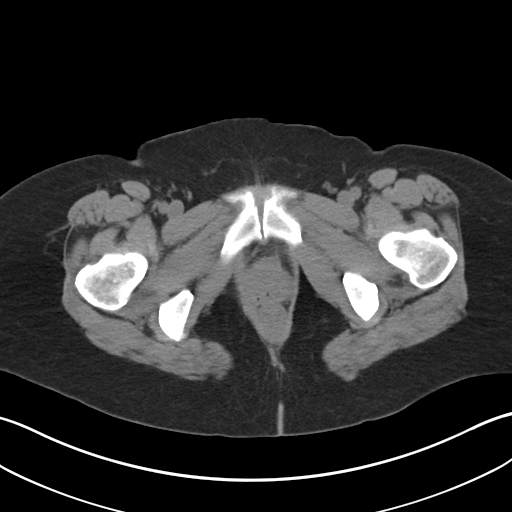
[im 24/103  soft-tissue]
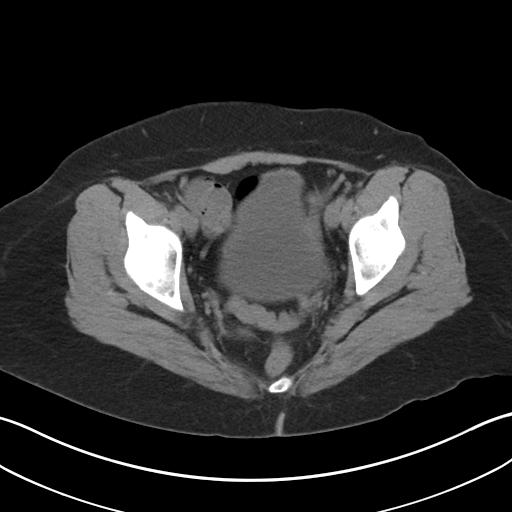
[im 28/103  soft-tissue]
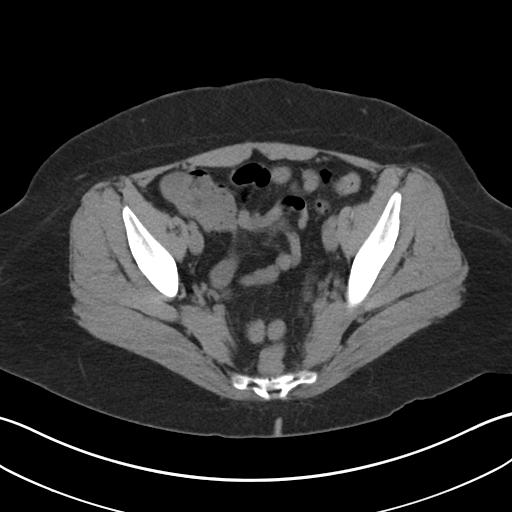
[im 38/103  soft-tissue]
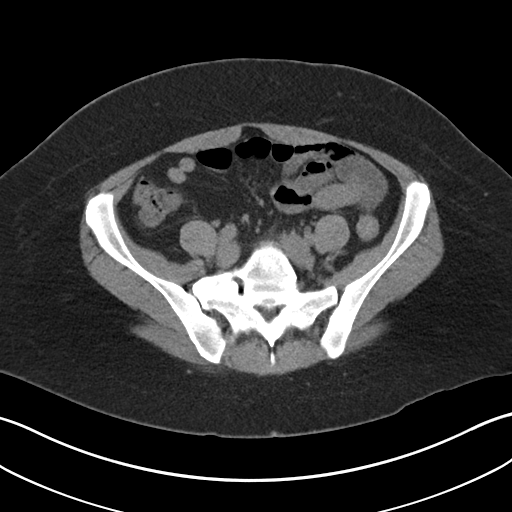
[im 42/103  soft-tissue]
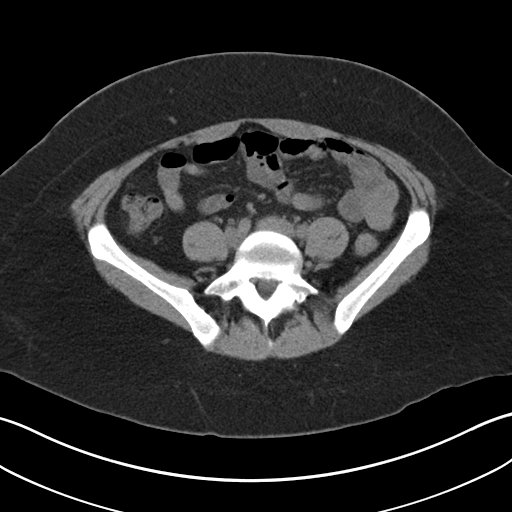
[im 52/103  soft-tissue]
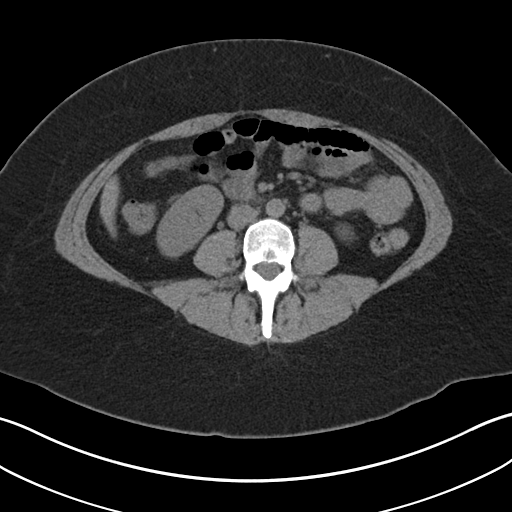
[im 61/103  soft-tissue]
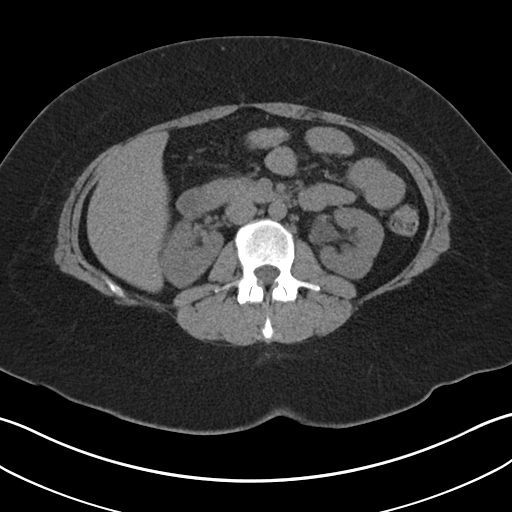
[im 65/103  soft-tissue]
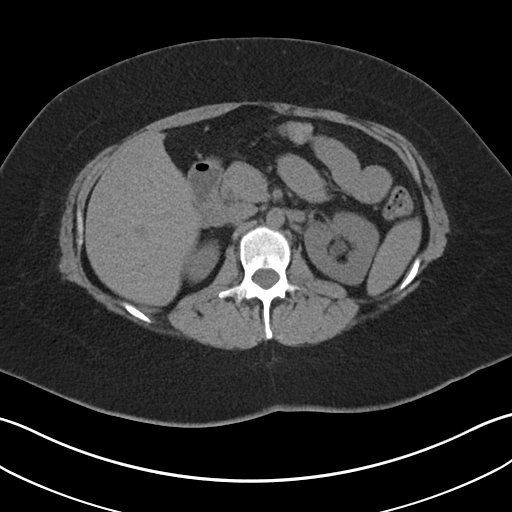
[im 65/103  bone]
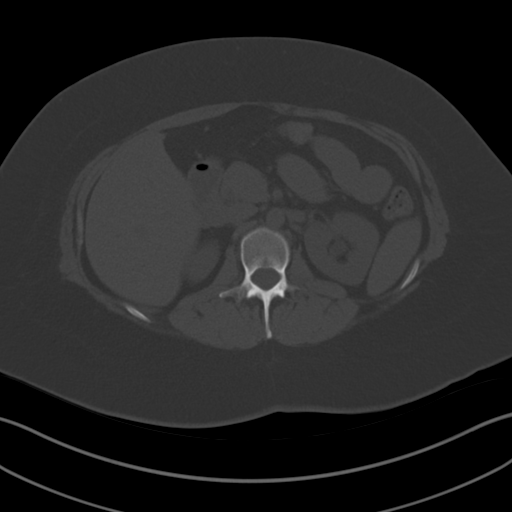
[im 75/103  soft-tissue]
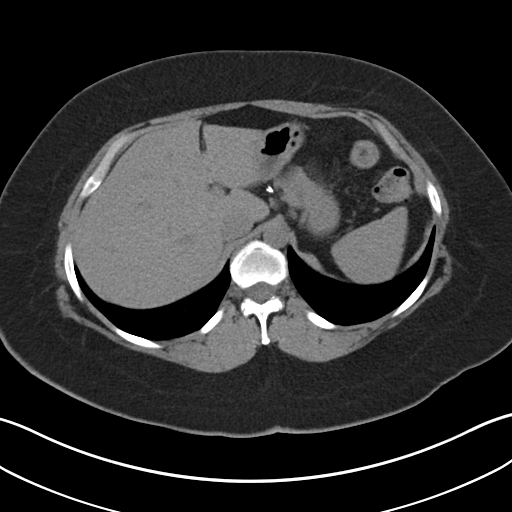
[im 79/103  soft-tissue]
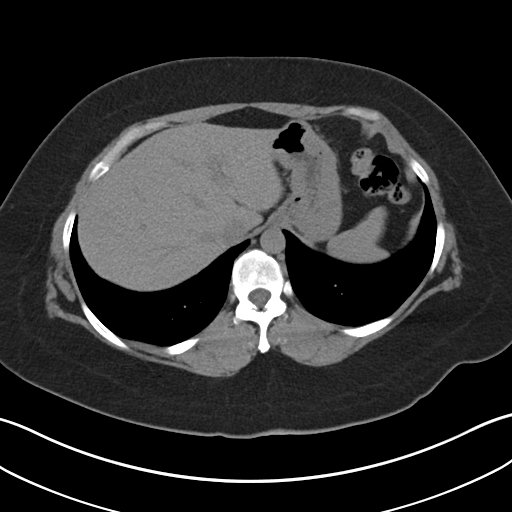
[im 89/103  soft-tissue]
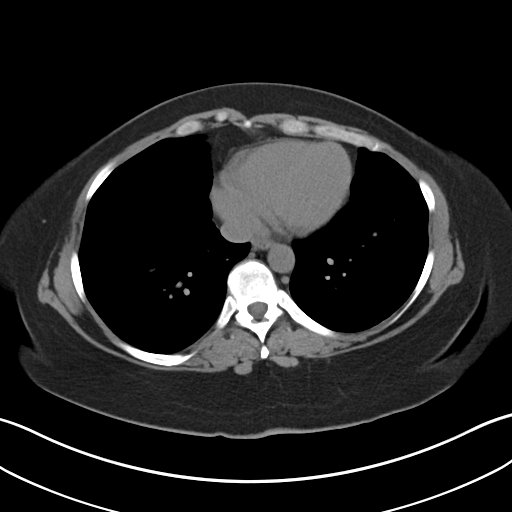
[im 98/103  soft-tissue]
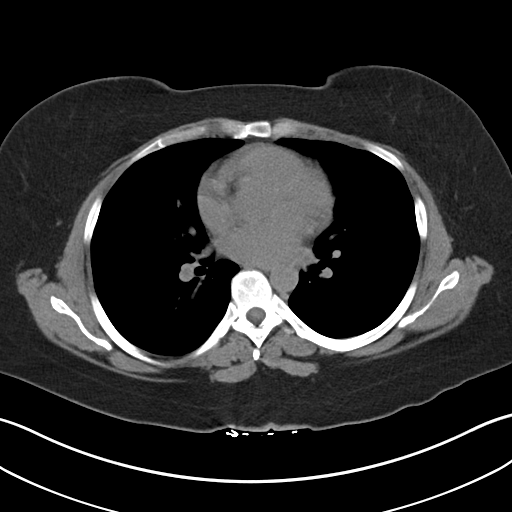

[Series 5: coronal soft tissue · coronal · 0.90mm/px · 3 of 91 slices shown]
[im 31/91  soft-tissue]
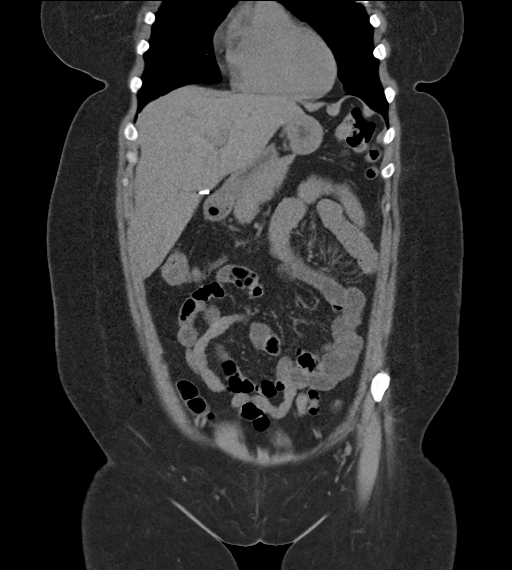
[im 41/91  soft-tissue]
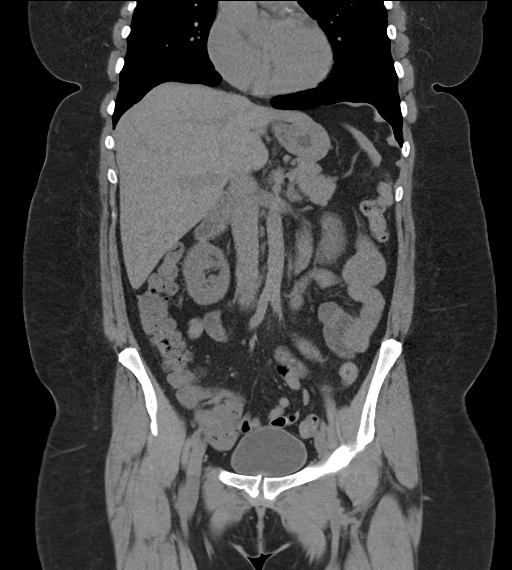
[im 51/91  soft-tissue]
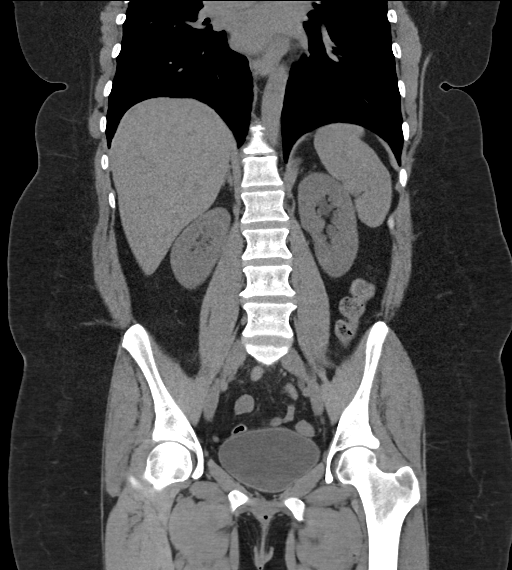

[16 of 46 positions shown; findings below may reference images not displayed]

FINDINGS: Lower chest: No acute abnormality.

Hepatobiliary: No focal liver abnormality is seen. Status post
cholecystectomy. No biliary dilatation.

Pancreas: Unremarkable. No pancreatic ductal dilatation or
surrounding inflammatory changes.

Spleen: Normal in size without focal abnormality.

Adrenals/Urinary Tract: The adrenal glands are normal. There is a 1
mm nonobstructing stone in the lateral midpole right kidney.
Bilateral parapelvic renal cysts are identified. There is no
hydronephrosis bilaterally. There is a 2 mm stone in the posterior
bladder lumen. The bladder is otherwise normal.

Stomach/Bowel: Stomach is within normal limits. Appendix appears
normal. No evidence of bowel wall thickening, distention, or
inflammatory changes.

Vascular/Lymphatic: No significant vascular findings are present. No
enlarged abdominal or pelvic lymph nodes.

Reproductive: Status post hysterectomy. No adnexal masses.

Other: There is minimal umbilical herniation of mesenteric fat.

Musculoskeletal: No acute or significant osseous findings.
IMPRESSION: A 2 mm stone in the posterior bladder lumen. This could represent a
recently passed stone. There is no hydronephrosis bilaterally. Small
nonobstructing stone is identified in the right kidney.

## 2022-07-14 ENCOUNTER — Emergency Department (HOSPITAL_COMMUNITY): Payer: Self-pay

## 2022-07-14 ENCOUNTER — Encounter (HOSPITAL_COMMUNITY): Payer: Self-pay

## 2022-07-14 ENCOUNTER — Inpatient Hospital Stay (HOSPITAL_COMMUNITY)
Admission: EM | Admit: 2022-07-14 | Discharge: 2022-07-17 | DRG: 378 | Disposition: A | Discharge status: Home / Self Care | Payer: Self-pay | Attending: Family Medicine | Admitting: Family Medicine

## 2022-07-14 DIAGNOSIS — R55 Syncope and collapse: Secondary | ICD-10-CM | POA: Diagnosis present

## 2022-07-14 DIAGNOSIS — Z79899 Other long term (current) drug therapy: Secondary | ICD-10-CM

## 2022-07-14 DIAGNOSIS — R42 Dizziness and giddiness: Secondary | ICD-10-CM

## 2022-07-14 DIAGNOSIS — K264 Chronic or unspecified duodenal ulcer with hemorrhage: Principal | ICD-10-CM

## 2022-07-14 DIAGNOSIS — Z791 Long term (current) use of non-steroidal anti-inflammatories (NSAID): Secondary | ICD-10-CM

## 2022-07-14 DIAGNOSIS — K921 Melena: Secondary | ICD-10-CM | POA: Diagnosis present

## 2022-07-14 DIAGNOSIS — K922 Gastrointestinal hemorrhage, unspecified: Secondary | ICD-10-CM | POA: Diagnosis present

## 2022-07-14 DIAGNOSIS — D62 Acute posthemorrhagic anemia: Secondary | ICD-10-CM

## 2022-07-14 DIAGNOSIS — Z87891 Personal history of nicotine dependence: Secondary | ICD-10-CM

## 2022-07-14 DIAGNOSIS — K219 Gastro-esophageal reflux disease without esophagitis: Secondary | ICD-10-CM | POA: Diagnosis present

## 2022-07-14 DIAGNOSIS — I959 Hypotension, unspecified: Secondary | ICD-10-CM | POA: Diagnosis present

## 2022-07-14 DIAGNOSIS — G8929 Other chronic pain: Secondary | ICD-10-CM | POA: Diagnosis present

## 2022-07-14 DIAGNOSIS — Z880 Allergy status to penicillin: Secondary | ICD-10-CM

## 2022-07-14 HISTORY — DX: Cervicalgia: M54.2

## 2022-07-14 LAB — POC OCCULT BLOOD, ED: Fecal Occult Bld: POSITIVE — AB

## 2022-07-14 LAB — URINALYSIS, ROUTINE W REFLEX MICROSCOPIC
Bilirubin Urine: NEGATIVE
Glucose, UA: NEGATIVE mg/dL
Hgb urine dipstick: NEGATIVE
Ketones, ur: 5 mg/dL — AB
Nitrite: NEGATIVE
Protein, ur: NEGATIVE mg/dL
Specific Gravity, Urine: 1.017 (ref 1.005–1.030)
pH: 5 (ref 5.0–8.0)

## 2022-07-14 LAB — HEPATIC FUNCTION PANEL
ALT: 19 U/L (ref 0–44)
AST: 15 U/L (ref 15–41)
Albumin: 3.3 g/dL — ABNORMAL LOW (ref 3.5–5.0)
Alkaline Phosphatase: 51 U/L (ref 38–126)
Bilirubin, Direct: <0.1 mg/dL (ref 0.0–0.2)
Total Bilirubin: 0.6 mg/dL (ref 0.3–1.2)
Total Protein: 5.7 g/dL — ABNORMAL LOW (ref 6.5–8.1)

## 2022-07-14 LAB — BASIC METABOLIC PANEL
Anion gap: 9 (ref 5–15)
BUN: 47 mg/dL — ABNORMAL HIGH (ref 6–20)
CO2: 23 mmol/L (ref 22–32)
Calcium: 8.8 mg/dL — ABNORMAL LOW (ref 8.9–10.3)
Chloride: 107 mmol/L (ref 98–111)
Creatinine, Ser: 0.85 mg/dL (ref 0.44–1.00)
GFR, Estimated: >60 mL/min (ref >60)
Glucose, Bld: 106 mg/dL — ABNORMAL HIGH (ref 70–99)
Potassium: 4.1 mmol/L (ref 3.5–5.1)
Sodium: 139 mmol/L (ref 135–145)

## 2022-07-14 LAB — I-STAT BETA HCG BLOOD, ED (MC, WL, AP ONLY): I-stat hCG, quantitative: 5.9 m[IU]/mL — ABNORMAL HIGH (ref <5)

## 2022-07-14 LAB — TYPE AND SCREEN
ABO/RH(D): O POS
Antibody Screen: NEGATIVE

## 2022-07-14 LAB — CBG MONITORING, ED: Glucose-Capillary: 88 mg/dL (ref 70–99)

## 2022-07-14 LAB — CBC
HCT: 33.6 % — ABNORMAL LOW (ref 36.0–46.0)
Hemoglobin: 10.7 g/dL — ABNORMAL LOW (ref 12.0–15.0)
MCH: 29.7 pg (ref 26.0–34.0)
MCHC: 31.8 g/dL (ref 30.0–36.0)
MCV: 93.3 fL (ref 80.0–100.0)
Platelets: 252 10*3/uL (ref 150–400)
RBC: 3.6 MIL/uL — ABNORMAL LOW (ref 3.87–5.11)
RDW: 12.8 % (ref 11.5–15.5)
WBC: 12 10*3/uL — ABNORMAL HIGH (ref 4.0–10.5)
nRBC: 0 % (ref 0.0–0.2)

## 2022-07-14 LAB — ABO/RH: ABO/RH(D): O POS

## 2022-07-14 LAB — TROPONIN I (HIGH SENSITIVITY)
Troponin I (High Sensitivity): 3 ng/L (ref <18)
Troponin I (High Sensitivity): <2 ng/L (ref <18)

## 2022-07-14 MED ORDER — PANTOPRAZOLE SODIUM 40 MG IV SOLR
40.0000 mg | Freq: Once | INTRAVENOUS | Status: AC
  Administered 2022-07-14: 40 mg via INTRAVENOUS
  Filled 2022-07-14: qty 10

## 2022-07-14 MED ORDER — LACTATED RINGERS IV BOLUS
1000.0000 mL | Freq: Once | INTRAVENOUS | Status: AC
  Administered 2022-07-14: 1000 mL via INTRAVENOUS

## 2022-07-14 NOTE — ED Triage Notes (Signed)
Pt [place] [employer] [facility] BIB [facility], pt became nauseated and lightheaded, and felt as if she could pass out. EMS reports SBP 90 on arrival and initial sats 90 on RA, vitals are now stable, CBG 125, A&O x4, pt has hx of chronic neck pain.

## 2022-07-14 NOTE — H&P (Incomplete)
History and Physical    Megan Melton:[id number] DOB: 1975/07/08 DOA: 07/14/2022  PCP: Megan Peak, PA-C  Patient coming from: Home  I have personally briefly reviewed patient's old medical records [facility]  Chief Complaint: Near syncope, black stools  HPI: Megan Melton is a 47 y.o. female with medical history significant for chronic neck pain who presented to the ED for evaluation after near syncopal episode.  ***  ED Course  Labs/Imaging on admission: I have personally reviewed following labs and imaging studies.  Initial vitals showed BP 89/61, pulse 76, RR 16, temp 97.9 F, SpO2 99% on room air.  BP improved to 122/78 with IV fluids.  Labs show hemoglobin 10.7, platelets 252,000, WBC 12.0, sodium 139, potassium 4.1, bicarb 23, BUN 47, creatinine 0.85, serum glucose 106, LFTs within normal limits, troponin negative x 2.  I-STAT beta-hCG 5.9.  FOBT is positive.  UA shows negative nitrites, moderate leukocytes, 0-5 RBCs, 6-10 WBCs, rare bacteria on microscopy.  Portable chest x-ray negative for focal consolidation, edema, effusion.  Patient was given 1 L LR and IV Protonix 40 mg.  The hospitalist service was consulted to admit for further evaluation and management.  Review of Systems: All systems reviewed and are negative except as documented in history of present illness above.   Past Medical History:  Diagnosis Date   Cervical pain (neck)     Past Surgical History:  Procedure Laterality Date   ABDOMINAL HYSTERECTOMY     full hysterectomy   CHOLECYSTECTOMY      Social History:  reports that she quit smoking about 12 years ago. Her smoking use included cigarettes. She does not have any smokeless tobacco history on file. She reports current alcohol use. She reports that she does not use drugs.  Allergies  Allergen Reactions   Penicillins     convulsions    History reviewed. No pertinent family history.   Prior to Admission medications    Medication Sig Start Date End Date Taking? Authorizing Provider  Biotin 300 MCG TABS Take 300 mcg by mouth daily.    [provider]  Cholecalciferol (VITAMIN D3) 5000 units CAPS Take 5,000 Units by mouth daily.    [provider]  Cyanocobalamin (VITAMIN B-12 PO) Take 1 tablet by mouth daily.    [provider]  cyclobenzaprine (FLEXERIL) 10 MG tablet Take 1 tablet (10 mg total) by mouth 2 (two) times daily as needed for muscle spasms. 03/13/15   Hedges, Tinnie Gens, PA-C  traMADol (ULTRAM) 50 MG tablet Take 1 tablet (50 mg total) by mouth every 6 (six) hours as needed. 03/13/15   Eyvonne Mechanic, PA-C    Physical Exam: Vitals:   07/14/22 2100 07/14/22 2115 07/14/22 2130 07/14/22 2300  BP: 116/72 108/69 122/78 (!) 108/56  Pulse: 73 73 91 79  Resp: 14 15 15 14   Temp:      TempSrc:      SpO2: 100% 100% 100% 98%  Weight:      Height:       *** Constitutional: NAD, calm, comfortable Eyes: PERRL, lids and conjunctivae normal ENMT: Mucous membranes are moist. Posterior pharynx clear of any exudate or lesions.Normal dentition.  Neck: normal, supple, no masses. Respiratory: clear to auscultation bilaterally, no wheezing, no crackles. Normal respiratory effort. No accessory muscle use.  Cardiovascular: Regular rate and rhythm, no murmurs / rubs / gallops. No extremity edema. 2+ pedal pulses. Abdomen: no tenderness, no masses palpated. No hepatosplenomegaly. Bowel sounds positive.  Musculoskeletal:  no clubbing / cyanosis. No joint deformity upper and lower extremities. Good ROM, no contractures. Normal muscle tone.  Skin: no rashes, lesions, ulcers. No induration Neurologic: CN 2-12 grossly intact. Sensation intact. Strength 5/5 in all 4.  Psychiatric: Normal judgment and insight. Alert and oriented x 3. Normal mood.   EKG: Personally reviewed. Sinus rhythm, rate 69, low voltage, no acute changes.  No prior for comparison.  Assessment/Plan Principal Problem:   Near  syncope Active Problems:   Melena   *** No notes on file *** Assessment and Plan: No notes have been filed under this hospital service. Service: Hospitalist  Melena: ***  Near syncope: ***  Chronic neck pain: ***    DVT prophylaxis: ***  Code Status: ***  Family Communication: ***  Disposition Plan: ***  Consults called: ***  Severity of Illness: {Observation/Inpatient:21159}  Darreld Mclean MD [facility]  If 7PM-7AM, please contact night-coverage [email or web address]  07/14/2022, 11:49 PM

## 2022-07-14 NOTE — ED Notes (Signed)
Pt reports that when she was at the spear time she had to suddenly have a BM, pt reports it was loose, dark brown, almost black, and very foul smelling per her husband. No hx of same. Pt was POC occult +

## 2022-07-14 NOTE — ED Notes (Signed)
ED TO INPATIENT HANDOFF REPORT  ED Nurse Name and Phone #: [phone] [name] Name/Age/Gender Megan Melton 47 y.o. female Room/Bed: 022C/022C  Code Status   Code Status: Not on file  Home/SNF/Other Home Patient oriented to: self, place, time, and situation Is this baseline? Yes   Triage Complete: Triage complete  Chief Complaint Near syncope [R55]  Triage Note Pt arrived from spare time entertainment for birthday party BIB [facility], pt became nauseated and lightheaded, and felt as if she could pass out. EMS reports SBP 90 on arrival and initial sats 90 on RA, vitals are now stable, CBG 125, A&O x4, pt has hx of chronic neck pain.    Allergies Allergies  Allergen Reactions   Penicillins     convulsions    Level of Care/Admitting Diagnosis ED Disposition     ED Disposition  Admit   Condition  --   Comment  Hospital Area: [facility] [[id number]]  Level of Care: Telemetry Medical [104]  May place patient in observation at [facility] or [facility] if equivalent level of care is available:: No  Covid Evaluation: Asymptomatic - no recent exposure (last 10 days) testing not required  Diagnosis: Near syncope [phone]  Admitting Physician: Charlsie Quest [[id number]]  Attending Physician: Charlsie Quest [[id number]]          B Medical/Surgery History Past Medical History:  Diagnosis Date   Cervical pain (neck)    Past Surgical History:  Procedure Laterality Date   ABDOMINAL HYSTERECTOMY     full hysterectomy   CHOLECYSTECTOMY       A IV Location/Drains/Wounds Patient Lines/Drains/Airways Status     Active Line/Drains/Airways     Name Placement date Placement time Site Days   Peripheral IV 07/14/22 20 G Anterior;Left;Proximal Antecubital 07/14/22  1905  Antecubital  less than 1            Intake/Output Last 24 hours  Intake/Output Summary (Last 24 hours) at 07/14/2022 2358 Last data filed at 07/14/2022 2237 Gross per 24 hour  Intake  1000 ml  Output --  Net 1000 ml    Labs/Imaging Results for orders placed or performed during the hospital encounter of 07/14/22 (from the past 48 hour(s))  ABO/Rh     Status: None   Collection Time: 07/14/22  7:26 PM  Result Value Ref Range   ABO/RH(D)      O POS Performed at [facility], [address] [place], [place] [address]   CBG monitoring, ED     Status: None   Collection Time: 07/14/22  7:28 PM  Result Value Ref Range   Glucose-Capillary 88 70 - 99 mg/dL    Comment: Glucose reference range applies only to samples taken after fasting for at least 8 hours.  Type and screen [facility]     Status: None   Collection Time: 07/14/22  8:07 PM  Result Value Ref Range   ABO/RH(D) O POS    Antibody Screen NEG    Sample Expiration      07/17/2022,2359 Performed at [facility], [address] [place], [place] [address]   I-Stat beta hCG blood, ED ([facility], [facility], [facility] only)     Status: Abnormal   Collection Time: 07/14/22  8:14 PM  Result Value Ref Range   I-stat hCG, quantitative 5.9 (H) <5 mIU/mL   Comment 3            Comment:  GEST. AGE      CONC.  (mIU/mL)   <=1 WEEK        5 - 50     2 WEEKS       50 - 500     3 WEEKS       100 - 10,000     4 WEEKS     1,000 - 30,000        FEMALE AND NON-PREGNANT FEMALE:     LESS THAN 5 mIU/mL   Hepatic function panel     Status: Abnormal   Collection Time: 07/14/22  8:45 PM  Result Value Ref Range   Total Protein 5.7 (L) 6.5 - 8.1 g/dL   Albumin 3.3 (L) 3.5 - 5.0 g/dL   AST 15 15 - 41 U/L   ALT 19 0 - 44 U/L   Alkaline Phosphatase 51 38 - 126 U/L   Total Bilirubin 0.6 0.3 - 1.2 mg/dL   Bilirubin, Direct <7.8 0.0 - 0.2 mg/dL   Indirect Bilirubin NOT CALCULATED 0.3 - 0.9 mg/dL    Comment: Performed at [facility], [address], [place], [place] [address]  Troponin I (High Sensitivity)     Status: None   Collection Time: 07/14/22  8:45 PM  Result Value Ref Range   Troponin I (High Sensitivity)  3 <18 ng/L    Comment: (NOTE) Elevated high sensitivity troponin I (hsTnI) values and significant  changes across serial measurements may suggest ACS but many other  chronic and acute conditions are known to elevate hsTnI results.  Refer to the "Links" section for chest pain algorithms and additional  guidance. Performed at [facility], [address], [place], [place] [address]   Basic metabolic panel     Status: Abnormal   Collection Time: 07/14/22  8:45 PM  Result Value Ref Range   Sodium 139 135 - 145 mmol/L   Potassium 4.1 3.5 - 5.1 mmol/L   Chloride 107 98 - 111 mmol/L   CO2 23 22 - 32 mmol/L   Glucose, Bld 106 (H) 70 - 99 mg/dL    Comment: Glucose reference range applies only to samples taken after fasting for at least 8 hours.   BUN 47 (H) 6 - 20 mg/dL   Creatinine, Ser 5.78 0.44 - 1.00 mg/dL   Calcium 8.8 (L) 8.9 - 10.3 mg/dL   GFR, Estimated >46 >96 mL/min    Comment: (NOTE) Calculated using the CKD-EPI Creatinine Equation (2021)    Anion gap 9 5 - 15    Comment: Performed at [facility], [address] [place], [place] [address]  CBC     Status: Abnormal   Collection Time: 07/14/22  8:45 PM  Result Value Ref Range   WBC 12.0 (H) 4.0 - 10.5 K/uL   RBC 3.60 (L) 3.87 - 5.11 MIL/uL   Hemoglobin 10.7 (L) 12.0 - 15.0 g/dL   HCT 41.3 (L) 24.4 - 01.0 %   MCV 93.3 80.0 - 100.0 fL   MCH 29.7 26.0 - 34.0 pg   MCHC 31.8 30.0 - 36.0 g/dL   RDW 27.2 53.6 - 64.4 %   Platelets 252 150 - 400 K/uL    Comment: REPEATED TO VERIFY PLATELET COUNT CONFIRMED BY SMEAR    nRBC 0.0 0.0 - 0.2 %    Comment: Performed at [facility], [address] [place], [place] [address]  Urinalysis, Routine w reflex microscopic -Urine, Clean Catch  Status: Abnormal   Collection Time: 07/14/22 10:17 PM  Result Value Ref Range   Color, Urine STRAW (A) YELLOW   APPearance CLEAR CLEAR   Specific Gravity, Urine 1.017 1.005 - 1.030   pH 5.0 5.0 - 8.0   Glucose, UA NEGATIVE  NEGATIVE mg/dL   Hgb urine dipstick NEGATIVE NEGATIVE   Bilirubin Urine NEGATIVE NEGATIVE   Ketones, ur 5 (A) NEGATIVE mg/dL   Protein, ur NEGATIVE NEGATIVE mg/dL   Nitrite NEGATIVE NEGATIVE   Leukocytes,Ua MODERATE (A) NEGATIVE   RBC / HPF 0-5 0 - 5 RBC/hpf   WBC, UA 6-10 0 - 5 WBC/hpf   Bacteria, UA RARE (A) NONE SEEN   Squamous Epithelial / HPF 0-5 0 - 5 /HPF   Mucus PRESENT     Comment: Performed at [facility], [address], [place], [place] [address]  Troponin I (High Sensitivity)     Status: None   Collection Time: 07/14/22 10:47 PM  Result Value Ref Range   Troponin I (High Sensitivity) <2 <18 ng/L    Comment: (NOTE) Elevated high sensitivity troponin I (hsTnI) values and significant  changes across serial measurements may suggest ACS but many other  chronic and acute conditions are known to elevate hsTnI results.  Refer to the "Links" section for chest pain algorithms and additional  guidance. Performed at [facility], [address] [place], [place] [address]   POC occult blood, ED     Status: Abnormal   Collection Time: 07/14/22 11:19 PM  Result Value Ref Range   Fecal Occult Bld POSITIVE (A) NEGATIVE   DG CHEST PORT 1 VIEW  Result Date: 07/14/2022 CLINICAL DATA:  Syncope EXAM: PORTABLE CHEST 1 VIEW COMPARISON:  Chest x-ray 10/14/2010 FINDINGS: The heart size and mediastinal contours are within normal limits. Both lungs are clear. The visualized skeletal structures are unremarkable. IMPRESSION: No active disease. Electronically Signed   By: Darliss Cheney M.D.   On: 07/14/2022 20:36    Pending Labs Unresulted Labs (From admission, onward)    None       Vitals/Pain Today's Vitals   07/14/22 2100 07/14/22 2115 07/14/22 2130 07/14/22 2300  BP: 116/72 108/69 122/78 (!) 108/56  Pulse: 73 73 91 79  Resp: 14 15 15 14   Temp:      TempSrc:      SpO2: 100% 100% 100% 98%  Weight:      Height:      PainSc:        Isolation Precautions No active  isolations  Medications Medications  lactated ringers bolus 1,000 mL (0 mLs Intravenous Stopped 07/14/22 2237)  pantoprazole (PROTONIX) injection 40 mg (40 mg Intravenous Given 07/14/22 2340)    Mobility walks     Focused Assessments Neuro Assessment Handoff:  Neuro Assessment: Within Defined Limits Neuro Checks:      Has TPA been given? No If patient is a Neuro Trauma and patient is going to OR before floor call report to 4N Charge nurse: [phone] or [phone]   R Recommendations: See Admitting Provider Note  Report given to:   Additional Notes: A&O x4, NAD noted, VSS, pt is self-efficient, ambulatory, no significant hx, one episode of dark brown stool tonight and foul smelling. Was occult blood +

## 2022-07-14 NOTE — ED Notes (Signed)
Recollect blood

## 2022-07-14 NOTE — ED Provider Notes (Signed)
[place] EMERGENCY DEPARTMENT AT [facility] Provider Note   CSN: [id number] Arrival date & time: 07/14/22  1903     History {Add pertinent medical, surgical, social history, OB history to HPI:1} Chief Complaint  Patient presents with  . Near Syncope    Megan Melton is a 47 y.o. female.  47 year old female with history of left-sided chronic neck pain presenting for near syncope.  Patient states today she was at a birthday party for her granddaughter.  She became lightheaded and dizzy there as well as hot.  She had had some left-sided neck pain and chest pain as well during that time and mild nausea but no vomiting.  She sat down and did not pass out.  She went to the bathroom and had a large, foul-smelling black bowel movement.  No blood in her stool.  She had a headache earlier, but none currently and was not sudden onset and similar to prior headaches.  No fevers or chills.  No history of GI bleeding.  She does take BC powders frequently.  She does not smoke or drink alcohol significantly.  She is not on anticoagulation.  No history of ACS or stroke.   Near Syncope Associated symptoms include chest pain.       Home Medications Prior to Admission medications   Medication Sig Start Date End Date Taking? Authorizing Provider  Biotin 300 MCG TABS Take 300 mcg by mouth daily.    [provider]  Cholecalciferol (VITAMIN D3) 5000 units CAPS Take 5,000 Units by mouth daily.    [provider]  Cyanocobalamin (VITAMIN B-12 PO) Take 1 tablet by mouth daily.    [provider]  cyclobenzaprine (FLEXERIL) 10 MG tablet Take 1 tablet (10 mg total) by mouth 2 (two) times daily as needed for muscle spasms. 03/13/15   Hedges, Tinnie Gens, PA-C  traMADol (ULTRAM) 50 MG tablet Take 1 tablet (50 mg total) by mouth every 6 (six) hours as needed. 03/13/15   Hedges, Tinnie Gens, PA-C      Allergies    Penicillins    Review of Systems   Review of Systems  Cardiovascular:   Positive for chest pain and near-syncope.  Neurological:  Positive for dizziness and light-headedness.  All other systems reviewed and are negative.   Physical Exam Updated Vital Signs BP (!) 89/61 (BP Location: Right Arm)   Pulse 76   Temp 97.9 F (36.6 C) (Oral)   Resp 16   Ht 5\' 2"  (1.575 m)   Wt 83.5 kg   SpO2 99%   BMI 33.65 kg/m  Physical Exam Vitals and nursing note reviewed. Exam conducted with a chaperone present.  Constitutional:      General: She is not in acute distress.    Appearance: She is well-developed.  HENT:     Head: Normocephalic and atraumatic.     Nose: Nose normal.     Mouth/Throat:     Mouth: Mucous membranes are moist.     Pharynx: Oropharynx is clear.  Eyes:     Conjunctiva/sclera: Conjunctivae normal.  Cardiovascular:     Rate and Rhythm: Normal rate and regular rhythm.     Heart sounds: No murmur heard. Pulmonary:     Effort: Pulmonary effort is normal. No respiratory distress.     Breath sounds: Normal breath sounds.  Abdominal:     Palpations: Abdomen is soft.     Tenderness: There is no abdominal tenderness. There is no guarding or rebound.  Genitourinary:  Rectum: Guaiac result positive.  Musculoskeletal:        General: No swelling.     Cervical back: Normal range of motion and neck supple. No rigidity.  Skin:    General: Skin is warm and dry.     Capillary Refill: Capillary refill takes less than 2 seconds.  Neurological:     General: No focal deficit present.     Mental Status: She is alert and oriented to person, place, and time. Mental status is at baseline.     Cranial Nerves: No cranial nerve deficit.     Sensory: No sensory deficit.     Motor: No weakness.  Psychiatric:        Mood and Affect: Mood normal.    ED Results / Procedures / Treatments   Labs (all labs ordered are listed, but only abnormal results are displayed) Labs Reviewed - No data to display  EKG None  Radiology No results  found.  Procedures Procedures  {Document cardiac monitor, telemetry assessment procedure when appropriate:1}  Medications Ordered in ED Medications - No data to display  ED Course/ Medical Decision Making/ A&P Clinical Course as of 07/15/22 0001  Sat Jul 14, 2022  1948 [ ]  stool [JD]    Clinical Course User Index [JD] Fulton Reek, MD   {   Click here for ABCD2, HEART and other calculatorsREFRESH Note before signing :1}                          Medical Decision Making Amount and/or Complexity of Data Reviewed Labs: ordered. Radiology: ordered.  Risk Prescription drug management. Decision regarding hospitalization.   47 year old female presenting for melanotic stool and presyncope.  Vital signs reviewed.  On exam she is asymptomatic.  Abdominal exam is benign.  She does have melanotic stool on rectal exam concern for upper GI bleeding.  Protonix ordered.  {Document critical care time when appropriate:1} {Document review of labs and clinical decision tools ie heart score, Chads2Vasc2 etc:1}  {Document your independent review of radiology images, and any outside records:1} {Document your discussion with family members, caretakers, and with consultants:1} {Document social determinants of health affecting pt's care:1} {Document your decision making why or why not admission, treatments were needed:1} Final Clinical Impression(s) / ED Diagnoses Final diagnoses:  None    Rx / DC Orders ED Discharge Orders     None

## 2022-07-15 ENCOUNTER — Observation Stay (HOSPITAL_COMMUNITY): Payer: Self-pay | Admitting: Certified Registered Nurse Anesthetist

## 2022-07-15 ENCOUNTER — Encounter (HOSPITAL_COMMUNITY)
Admission: EM | Disposition: A | Discharge status: Home / Self Care | Payer: Self-pay | Source: Home / Self Care | Attending: Family Medicine

## 2022-07-15 ENCOUNTER — Encounter (HOSPITAL_COMMUNITY): Payer: Self-pay | Admitting: Internal Medicine

## 2022-07-15 DIAGNOSIS — Z6835 Body mass index (BMI) 35.0-35.9, adult: Secondary | ICD-10-CM

## 2022-07-15 DIAGNOSIS — K922 Gastrointestinal hemorrhage, unspecified: Secondary | ICD-10-CM | POA: Diagnosis present

## 2022-07-15 DIAGNOSIS — E669 Obesity, unspecified: Secondary | ICD-10-CM

## 2022-07-15 DIAGNOSIS — D649 Anemia, unspecified: Secondary | ICD-10-CM

## 2022-07-15 DIAGNOSIS — D62 Acute posthemorrhagic anemia: Secondary | ICD-10-CM

## 2022-07-15 DIAGNOSIS — K921 Melena: Secondary | ICD-10-CM

## 2022-07-15 DIAGNOSIS — R55 Syncope and collapse: Secondary | ICD-10-CM | POA: Diagnosis present

## 2022-07-15 DIAGNOSIS — K264 Chronic or unspecified duodenal ulcer with hemorrhage: Secondary | ICD-10-CM

## 2022-07-15 HISTORY — PX: SUBMUCOSAL INJECTION: SHX5543

## 2022-07-15 HISTORY — PX: ESOPHAGOGASTRODUODENOSCOPY (EGD) WITH PROPOFOL: SHX5813

## 2022-07-15 LAB — CBC
HCT: 30.3 % — ABNORMAL LOW (ref 36.0–46.0)
Hemoglobin: 9.8 g/dL — ABNORMAL LOW (ref 12.0–15.0)
MCH: 29.4 pg (ref 26.0–34.0)
MCHC: 32.3 g/dL (ref 30.0–36.0)
MCV: 91 fL (ref 80.0–100.0)
Platelets: 235 10*3/uL (ref 150–400)
RBC: 3.33 MIL/uL — ABNORMAL LOW (ref 3.87–5.11)
RDW: 12.8 % (ref 11.5–15.5)
WBC: 9 10*3/uL (ref 4.0–10.5)
nRBC: 0 % (ref 0.0–0.2)

## 2022-07-15 LAB — CBC WITH DIFFERENTIAL/PLATELET
Abs Immature Granulocytes: 0.04 10*3/uL (ref 0.00–0.07)
Basophils Absolute: 0.1 10*3/uL (ref 0.0–0.1)
Basophils Relative: 1 %
Eosinophils Absolute: 0.1 10*3/uL (ref 0.0–0.5)
Eosinophils Relative: 2 %
HCT: 30.9 % — ABNORMAL LOW (ref 36.0–46.0)
Hemoglobin: 10.1 g/dL — ABNORMAL LOW (ref 12.0–15.0)
Immature Granulocytes: 1 %
Lymphocytes Relative: 43 %
Lymphs Abs: 3.3 10*3/uL (ref 0.7–4.0)
MCH: 29.9 pg (ref 26.0–34.0)
MCHC: 32.7 g/dL (ref 30.0–36.0)
MCV: 91.4 fL (ref 80.0–100.0)
Monocytes Absolute: 0.5 10*3/uL (ref 0.1–1.0)
Monocytes Relative: 7 %
Neutro Abs: 3.6 10*3/uL (ref 1.7–7.7)
Neutrophils Relative %: 46 %
Platelets: 177 10*3/uL (ref 150–400)
RBC: 3.38 MIL/uL — ABNORMAL LOW (ref 3.87–5.11)
RDW: 13.1 % (ref 11.5–15.5)
WBC: 7.6 10*3/uL (ref 4.0–10.5)
nRBC: 0 % (ref 0.0–0.2)

## 2022-07-15 LAB — BASIC METABOLIC PANEL
Anion gap: 8 (ref 5–15)
BUN: 34 mg/dL — ABNORMAL HIGH (ref 6–20)
CO2: 21 mmol/L — ABNORMAL LOW (ref 22–32)
Calcium: 8.3 mg/dL — ABNORMAL LOW (ref 8.9–10.3)
Chloride: 107 mmol/L (ref 98–111)
Creatinine, Ser: 0.65 mg/dL (ref 0.44–1.00)
GFR, Estimated: >60 mL/min (ref >60)
Glucose, Bld: 90 mg/dL (ref 70–99)
Potassium: 3.6 mmol/L (ref 3.5–5.1)
Sodium: 136 mmol/L (ref 135–145)

## 2022-07-15 LAB — HIV ANTIBODY (ROUTINE TESTING W REFLEX): HIV Screen 4th Generation wRfx: NONREACTIVE

## 2022-07-15 SURGERY — ESOPHAGOGASTRODUODENOSCOPY (EGD) WITH PROPOFOL
Anesthesia: Monitor Anesthesia Care

## 2022-07-15 MED ORDER — PROPOFOL 500 MG/50ML IV EMUL
INTRAVENOUS | Status: DC | PRN
  Administered 2022-07-15: 180 ug/kg/min via INTRAVENOUS

## 2022-07-15 MED ORDER — PANTOPRAZOLE INFUSION (NEW) - SIMPLE MED
8.0000 mg/h | INTRAVENOUS | Status: DC
  Administered 2022-07-15 – 2022-07-17 (×5): 8 mg/h via INTRAVENOUS
  Filled 2022-07-15 (×6): qty 100

## 2022-07-15 MED ORDER — PROPOFOL 10 MG/ML IV BOLUS
INTRAVENOUS | Status: DC | PRN
  Administered 2022-07-15: 20 mg via INTRAVENOUS

## 2022-07-15 MED ORDER — ACETAMINOPHEN 500 MG PO TABS
ORAL_TABLET | ORAL | Status: AC
  Filled 2022-07-15: qty 2

## 2022-07-15 MED ORDER — ACETAMINOPHEN 500 MG PO TABS
1000.0000 mg | ORAL_TABLET | Freq: Once | ORAL | Status: AC
  Administered 2022-07-15: 1000 mg via ORAL

## 2022-07-15 MED ORDER — LIDOCAINE 2% (20 MG/ML) 5 ML SYRINGE
INTRAMUSCULAR | Status: DC | PRN
  Administered 2022-07-15: 40 mg via INTRAVENOUS

## 2022-07-15 MED ORDER — SODIUM CHLORIDE (PF) 0.9 % IJ SOLN
PREFILLED_SYRINGE | INTRAMUSCULAR | Status: DC | PRN
  Administered 2022-07-15: 2 mL

## 2022-07-15 MED ORDER — EPINEPHRINE 1 MG/10ML IJ SOSY
PREFILLED_SYRINGE | INTRAMUSCULAR | Status: AC
  Filled 2022-07-15: qty 10

## 2022-07-15 MED ORDER — LACTATED RINGERS IV SOLN: INTRAVENOUS | Status: DC

## 2022-07-15 MED ORDER — SODIUM CHLORIDE 0.9 % IV SOLN: INTRAVENOUS | Status: DC

## 2022-07-15 MED ORDER — PANTOPRAZOLE SODIUM 40 MG IV SOLR
40.0000 mg | Freq: Two times a day (BID) | INTRAVENOUS | Status: DC
  Administered 2022-07-15: 40 mg via INTRAVENOUS
  Filled 2022-07-15: qty 10

## 2022-07-15 MED ORDER — ONDANSETRON HCL 4 MG/2ML IJ SOLN: 4.0000 mg | Freq: Four times a day (QID) | INTRAMUSCULAR | Status: DC | PRN

## 2022-07-15 MED ORDER — PHENYLEPHRINE 80 MCG/ML (10ML) SYRINGE FOR IV PUSH (FOR BLOOD PRESSURE SUPPORT)
PREFILLED_SYRINGE | INTRAVENOUS | Status: DC | PRN
  Administered 2022-07-15: 80 ug via INTRAVENOUS

## 2022-07-15 MED ORDER — LACTATED RINGERS IV SOLN: INTRAVENOUS | Status: AC

## 2022-07-15 MED ORDER — SODIUM CHLORIDE 0.9% FLUSH
3.0000 mL | Freq: Two times a day (BID) | INTRAVENOUS | Status: DC
  Administered 2022-07-15 – 2022-07-16 (×4): 3 mL via INTRAVENOUS

## 2022-07-15 MED ORDER — ONDANSETRON HCL 4 MG PO TABS: 4.0000 mg | ORAL_TABLET | Freq: Four times a day (QID) | ORAL | Status: DC | PRN

## 2022-07-15 MED ORDER — ACETAMINOPHEN 650 MG RE SUPP: 650.0000 mg | Freq: Four times a day (QID) | RECTAL | Status: DC | PRN

## 2022-07-15 MED ORDER — ACETAMINOPHEN 325 MG PO TABS
650.0000 mg | ORAL_TABLET | Freq: Four times a day (QID) | ORAL | Status: DC | PRN
  Administered 2022-07-15 (×2): 650 mg via ORAL
  Filled 2022-07-15 (×2): qty 2

## 2022-07-15 SURGICAL SUPPLY — 15 items

## 2022-07-15 NOTE — Consult Note (Signed)
Consultation  Referring Provider:   [facility] Primary Care Physician:  Lonie Peak, PA-C Primary Gastroenterologist:  Gentry Fitz       Reason for Consultation:     Presyncopal episode and melena         HPI:   Megan Melton is a 47 y.o. female with past medical history significant for chronic neck pain, cholecystectomy, hysterectomy presented to the ER with presyncopal episode.  No family at bedside. Never had episode like this prior States all day yesterday she was feeling poorly.  Had some chest tightness and neck pain. Had urgency with normal-appearing bowel movement, associated dizziness and diaphoresis.    During evaluation with EMS patient had another urgent bowel movement this time large-volume melenic stools that were loose.  Denies any hematochezia.  Some upper abdominal discomfort.   Had nausea without vomiting. No further bowel movements. Patient states she used to have reflux years ago, has had long-term use of Goody powders and Aleve and ibuprofen for her neck.   Has not had much reflux recently but did go on a [place] cruise recently where she did have increase of alcohol and Aleve ibuprofen and some reflux during this time. Patient had 1 alcoholic beverage yesterday, denies regular alcohol use. Has had increasing neck pain and has been taking Goody powders twice daily with Aleve.  Quit smoking 12 years ago, no drug use.  Baseline patient has constipation with bowel movement 2 times a week, denies hematochezia.  No family history of stomach ulcers, colon cancer.  In the ER patient had soft blood pressure 89/61, no associated tachycardia, otherwise stable.  Blood pressure improved with IV fluids. Hgb 10.7, WBC 12, platelets 252.  BUN 47, creatinine 0.85.  LFTs unremarkable.  Troponin negative x 2, negative pregnancy test. FOBT positive. UA negative for nitrates, moderate leukocytes, rare bacteria.  Unremarkable chest x-ray.   Abnormal ED labs: Abnormal Labs Reviewed   URINALYSIS, ROUTINE W REFLEX MICROSCOPIC - Abnormal; Notable for the following components:      Result Value   Color, Urine STRAW (*)    Ketones, ur 5 (*)    Leukocytes,Ua MODERATE (*)    Bacteria, UA RARE (*)    All other components within normal limits  HEPATIC FUNCTION PANEL - Abnormal; Notable for the following components:   Total Protein 5.7 (*)    Albumin 3.3 (*)    All other components within normal limits  BASIC METABOLIC PANEL - Abnormal; Notable for the following components:   Glucose, Bld 106 (*)    BUN 47 (*)    Calcium 8.8 (*)    All other components within normal limits  CBC - Abnormal; Notable for the following components:   WBC 12.0 (*)    RBC 3.60 (*)    Hemoglobin 10.7 (*)    HCT 33.6 (*)    All other components within normal limits  CBC - Abnormal; Notable for the following components:   RBC 3.33 (*)    Hemoglobin 9.8 (*)    HCT 30.3 (*)    All other components within normal limits  BASIC METABOLIC PANEL - Abnormal; Notable for the following components:   CO2 21 (*)    BUN 34 (*)    Calcium 8.3 (*)    All other components within normal limits  I-STAT BETA HCG BLOOD, ED ([facility], [facility], [facility] ONLY) - Abnormal; Notable for the following components:   I-stat hCG, quantitative 5.9 (*)    All other  components within normal limits  POC OCCULT BLOOD, ED - Abnormal; Notable for the following components:   Fecal Occult Bld POSITIVE (*)    All other components within normal limits     Past Medical History:  Diagnosis Date   Cervical pain (neck)     Surgical History:  She  has a past surgical history that includes Abdominal hysterectomy and Cholecystectomy. Family History:  Her family history is not on file. Social History:   reports that she quit smoking about 12 years ago. Her smoking use included cigarettes. She does not have any smokeless tobacco history on file. She reports current alcohol use. She reports that she does not use drugs.  Prior to Admission  medications   Medication Sig Start Date End Date Taking? Authorizing Provider  BIOTIN PO Take 1 tablet by mouth daily.   Yes [provider]  cholecalciferol (VITAMIN D3) 25 MCG (1000 UNIT) tablet Take 1,000 Units by mouth daily.   Yes [provider]  Cyanocobalamin (VITAMIN B-12 PO) Take 1 tablet by mouth daily.   Yes [provider]  phentermine 30 MG capsule Take 30 mg by mouth every morning. 06/13/22  Yes [provider]  zolpidem (AMBIEN) 5 MG tablet Take 5 mg by mouth at bedtime as needed for sleep. 06/13/22  Yes [provider]    Current Facility-Administered Medications  Medication Dose Route Frequency Provider Last Rate Last Admin   acetaminophen (TYLENOL) tablet 650 mg  650 mg Oral Q6H PRN Charlsie Quest, MD   650 mg at 07/15/22 0123   Or   acetaminophen (TYLENOL) suppository 650 mg  650 mg Rectal Q6H PRN Charlsie Quest, MD       lactated ringers infusion   Intravenous Continuous Charlsie Quest, MD 125 mL/hr at 07/15/22 0120 New Bag at 07/15/22 0120   ondansetron (ZOFRAN) tablet 4 mg  4 mg Oral Q6H PRN Charlsie Quest, MD       Or   ondansetron (ZOFRAN) injection 4 mg  4 mg Intravenous Q6H PRN Charlsie Quest, MD       pantoprazole (PROTONIX) injection 40 mg  40 mg Intravenous Q12H Darreld Mclean R, MD       sodium chloride flush (NS) 0.9 % injection 3 mL  3 mL Intravenous Q12H Darreld Mclean R, MD   3 mL at 07/15/22 0123    Allergies as of 07/14/2022 - Review Complete 07/14/2022  Allergen Reaction Noted   Penicillins  03/13/2015    Review of Systems:    Constitutional: No weight loss, fever, chills, weakness or fatigue HEENT: Eyes: No change in vision               Ears, Nose, Throat:  No change in hearing or congestion Skin: No rash or itching Cardiovascular: No chest pain, chest pressure or palpitations   Respiratory: No SOB or cough Gastrointestinal: See HPI and otherwise negative Genitourinary: No dysuria or change in  urinary frequency Neurological: No headache, dizziness or syncope Musculoskeletal: No new muscle or joint pain Hematologic: No bleeding or bruising Psychiatric: No history of depression or anxiety     Physical Exam:  Vital signs in last 24 hours: Temp:  [97.9 F (36.6 C)-99.2 F (37.3 C)] 99.2 F (37.3 C) (05/12 0048) Pulse Rate:  [59-91] 59 (05/12 0426) Resp:  [11-22] 17 (05/12 0048) BP: (89-127)/(56-78) 107/59 (05/12 0426) SpO2:  [95 %-100 %] 95 % (05/12 0426) Weight:  [83.5 kg-85 kg] 85 kg (05/12 0048)  Last BM recorded by nurses in past 5 days No data recorded  General:   Pleasant, well developed female in no acute distress Head:  Normocephalic and atraumatic. Eyes: sclerae anicteric,conjunctive pink  Heart:  regular rate and rhythm Pulm: Clear anteriorly; no wheezing Abdomen:  Soft, Obese AB, Active bowel sounds. No tenderness . Without guarding and Without rebound, No organomegaly appreciated. Extremities:  Without edema. Msk:  Symmetrical without gross deformities. Peripheral pulses intact.  Neurologic:  Alert and  oriented x4;  No focal deficits.  Skin:   Dry and intact without significant lesions or rashes. Psychiatric:  Cooperative. Normal mood and affect.  LAB RESULTS: Recent Labs    07/14/22 2045 07/15/22 0327  WBC 12.0* 9.0  HGB 10.7* 9.8*  HCT 33.6* 30.3*  PLT 252 235   BMET Recent Labs    07/14/22 2045 07/15/22 0327  NA 139 136  K 4.1 3.6  CL 107 107  CO2 23 21*  GLUCOSE 106* 90  BUN 47* 34*  CREATININE 0.85 0.65  CALCIUM 8.8* 8.3*   LFT Recent Labs    07/14/22 2045  PROT 5.7*  ALBUMIN 3.3*  AST 15  ALT 19  ALKPHOS 51  BILITOT 0.6  BILIDIR <0.1  IBILI NOT CALCULATED   PT/INR No results for input(s): "LABPROT", "INR" in the last 72 hours.  STUDIES: DG CHEST PORT 1 VIEW  Result Date: 07/14/2022 CLINICAL DATA:  Syncope EXAM: PORTABLE CHEST 1 VIEW COMPARISON:  Chest x-ray 10/14/2010 FINDINGS: The heart size and mediastinal  contours are within normal limits. Both lungs are clear. The visualized skeletal structures are unremarkable. IMPRESSION: No active disease. Electronically Signed   By: Darliss Cheney M.D.   On: 07/14/2022 20:36      Impression    Melena without hemodynamic compromise In the setting of Ibuprofen and Goody Powders HGB 9.8 MCV 91.0 Platelets 235 BUN 34 Cr 0.65-did have isolated BUN elevation Likely upper GI bleed with melenic stools, NSAID use, Goody powder use, isolated BUN elevation.  Chronic neck pain Had some neck and chest discomfort initially Negative troponin Normal EKG, unremarkable chest x-ray Likely related to GERD/presyncopal episode.   Principal Problem:   Acute upper GI bleed Active Problems:   Near syncope   Melena    LOS: 0 days     Plan   - Protonix 40 mg IV BID. -keep NPO --Continue to monitor H&H with transfusion as needed to maintain hemoglobin greater than 7. -EGD today. I thoroughly discussed the procedure to include nature, alternatives, benefits, and risks including but not limited to bleeding, perforation, infection, anesthesia/cardiac and pulmonary complications. Patient provides understanding and gave verbal consent to proceed. -Counseled on avoiding NSAIDs -Will need outpatient colonoscopy screening, potentially with repeat EGD depending on today's findings.  Thank you for your kind consultation, we will continue to follow.   Doree Albee  07/15/2022, 8:08 AM

## 2022-07-15 NOTE — Hospital Course (Signed)
Megan Melton is a 47 y.o. female with medical history significant for chronic left-sided neck pain who is admitted with suspected acute upper GI bleed and near syncopal episode.

## 2022-07-15 NOTE — Anesthesia Preprocedure Evaluation (Signed)
Anesthesia Evaluation  Patient identified by MRN, date of birth, ID band Patient awake  General Assessment Comment:past medical history significant for chronic neck pain, cholecystectomy, hysterectomy presented to the ER with presyncopal episode   Reviewed: Allergy & Precautions, NPO status , Patient's Chart, lab work & pertinent test results  Airway Mallampati: II  TM Distance: >3 FB Neck ROM: Full    Dental  (+) Teeth Intact, Dental Advisory Given   Pulmonary former smoker   Pulmonary exam normal breath sounds clear to auscultation       Cardiovascular negative cardio ROS Normal cardiovascular exam Rhythm:Regular Rate:Normal     Neuro/Psych negative neurological ROS  negative psych ROS   GI/Hepatic Neg liver ROS,,,Melena and presumed upper GI bleed   Endo/Other  Obesity   Renal/GU negative Renal ROS     Musculoskeletal negative musculoskeletal ROS (+)    Abdominal   Peds  Hematology  (+) Blood dyscrasia, anemia   Anesthesia Other Findings Day of surgery medications reviewed with the patient.  Reproductive/Obstetrics                              Anesthesia Physical Anesthesia Plan  ASA: 2  Anesthesia Plan: MAC   Post-op Pain Management:    Induction: Intravenous  PONV Risk Score and Plan: 2 and TIVA and Treatment may vary due to age or medical condition  Airway Management Planned: Natural Airway and Simple Face Mask  Additional Equipment:   Intra-op Plan:   Post-operative Plan:   Informed Consent: I have reviewed the patients History and Physical, chart, labs and discussed the procedure including the risks, benefits and alternatives for the proposed anesthesia with the patient or authorized representative who has indicated his/her understanding and acceptance.     Dental advisory given  Plan Discussed with: CRNA and Anesthesiologist  Anesthesia Plan Comments:           Anesthesia Quick Evaluation

## 2022-07-15 NOTE — Transfer of Care (Signed)
Immediate Anesthesia Transfer of Care Note  Patient: Megan Melton  Procedure(s) Performed: ESOPHAGOGASTRODUODENOSCOPY (EGD) WITH PROPOFOL SUBMUCOSAL INJECTION  Patient Location: PACU  Anesthesia Type:MAC  Level of Consciousness: awake, alert , and oriented  Airway & Oxygen Therapy: Patient Spontanous Breathing  Post-op Assessment: Report given to RN and Post -op Vital signs reviewed and stable  Post vital signs: Reviewed and stable  Last Vitals:  Vitals Value Taken Time  BP 96/63 07/15/22 1142  Temp    Pulse 68 07/15/22 1142  Resp 17 07/15/22 1142  SpO2 96 % 07/15/22 1142    Last Pain:  Vitals:   07/15/22 1142  TempSrc:   PainSc: 0-No pain         Complications: No notable events documented.

## 2022-07-15 NOTE — Op Note (Signed)
[facility] Patient Name: Megan Melton Procedure Date : 07/15/2022 MRN: [id number] Attending MD: Beverley Fiedler , MD, [id number] Date of Birth: 1975/12/03 CSN: [id number] Age: 47 Admit Type: Inpatient Procedure:                Upper GI endoscopy Indications:              Acute post hemorrhagic anemia, Melena Providers:                Carie Caddy. Rhea Belton, MD, Rozetta Nunnery, Technician Referring MD:             [facility] Medicines:                Monitored Anesthesia Care Complications:            No immediate complications. Estimated Blood Loss:     Estimated blood loss: none. Procedure:                Pre-Anesthesia Assessment:                           - Prior to the procedure, a History and Physical                            was performed, and patient medications and                            allergies were reviewed. The patient's tolerance of                            previous anesthesia was also reviewed. The risks                            and benefits of the procedure and the sedation                            options and risks were discussed with the patient.                            All questions were answered, and informed consent                            was obtained. Prior Anticoagulants: The patient has                            taken no anticoagulant or antiplatelet agents. ASA                            Grade Assessment: II - A patient with mild systemic                            disease. After reviewing the risks and benefits,                            the patient was deemed in satisfactory condition to  undergo the procedure.                           After obtaining informed consent, the endoscope was                            passed under direct vision. Throughout the                            procedure, the patient's blood pressure, pulse, and                            oxygen saturations were monitored  continuously. The                            [id number]-H190 ([id number]) Olympus endoscope was introduced                            through the mouth, and advanced to the duodenal                            bulb. The upper GI endoscopy was accomplished                            without difficulty. The patient tolerated the                            procedure well. Scope In: Scope Out: Findings:      The examined esophagus was normal.      The entire examined stomach was normal.      One oozing cratered duodenal ulcer with pigmented red material was found       in the distal bulb at the sweep. The lesion was 10 mm in largest       dimension. Area was successfully injected with 2 mL of a 0.1 mg/mL       solution of epinephrine for hemostasis. Then PuraStat gel was applied       directly to the ulcer base and surrounding tissue with good result. No       bleeding at procedure end. Impression:               - Normal esophagus.                           - Normal stomach.                           - Oozing duodenal ulcer with pigmented material.                            Injected with epinephrine and PuraStat gel applied.                           - No specimens collected. Moderate Sedation:      N/A Recommendation:           - Return patient to hospital ward for ongoing care.                           -  Clear liquid diet.                           - Continue present medications.                           - Given endoscopic therapy would use PPI infusion                            for at least 24 hours, then BID x 8 weeks. Daily                            PPI is recommended if future use of high dose                            aspirin (Goody's powder) or NSAIDs.                           - No aspirin, ibuprofen, naproxen, or other                            non-steroidal anti-inflammatory drugs.                           - Serial Hgb. 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 All rights reserved. The codes documented in this report are preliminary and upon coder review may  be revised to meet current compliance requirements. Beverley Fiedler, MD 07/15/2022 11:43:50 AM This report has been signed electronically. Number of Addenda: 0

## 2022-07-15 NOTE — Progress Notes (Signed)
PROGRESS NOTE    Megan Melton  ZOX:[id number] DOB: Feb 23, 1976 DOA: 07/14/2022 PCP: Lonie Peak, PA-C   Brief Narrative:  HPI: Megan Melton is a 47 y.o. female with medical history significant for chronic left-sided neck pain who presented to the ED for evaluation after a near syncopal episode.   Patient was at a birthday party earlier today (5/11).  She suddenly became lightheaded and dizzy and broke out in sweats.  She went to the bathroom and had a normal-appearing bowel movement.  She felt a little bit better.  Afterwards she became lightheaded again.  She was diaphoretic.  She felt as if she was going to pass out.  She also felt pain along her left neck down to her chest.     EMS were called.  During their evaluation she had the urge to have another bowel movement.  She says this time this was a large foul-smelling dark black loose bowel movement.  She did not see any red blood.  She had some aching upper abdominal pain which she describes as a "sour" feeling.  She has had nausea but no vomiting.   Patient states that she had one alcoholic beverage at the party but otherwise denies significant regular alcohol use.   She does state that she takes BC/Goody powders twice a day for her neck pain as well as occasional Advil or Aleve.  She has not had any similar episodes in the past.   ED Course  Labs/Imaging on admission: I have personally reviewed following labs and imaging studies.   Initial vitals showed BP 89/61, pulse 76, RR 16, temp 97.9 F, SpO2 99% on room air.  BP improved to 122/78 with IV fluids.   Labs show hemoglobin 10.7, platelets 252,000, WBC 12.0, sodium 139, potassium 4.1, bicarb 23, BUN 47, creatinine 0.85, serum glucose 106, LFTs within normal limits, troponin negative x 2.  I-STAT beta-hCG 5.9.   FOBT is positive.  UA shows negative nitrites, moderate leukocytes, 0-5 RBCs, 6-10 WBCs, rare bacteria on microscopy.   Portable chest x-ray negative for focal  consolidation, edema, effusion.   Patient was given 1 L LR and IV Protonix 40 mg.  The hospitalist service was consulted to admit for further evaluation and management.  Assessment & Plan:   Principal Problem:   Acute upper GI bleed Active Problems:   Near syncope   Melena   ABLA (acute blood loss anemia)   Duodenal ulcer hemorrhagic   Pre-syncope  Acute blood loss anemia secondary to upper GI bleed/oozing duodenal ulcer: GI on board, s/p EGD 07/15/2022, found to have oozing duodenal ulcer, injected with epinephrine and purastate gel.  GI recommends continuing IV PPI for 24 hours and then twice daily for 8 weeks.  Will observe overnight, trend H&H.  Transfuse if less than 7.  Avoid NSAIDs.   Near syncope: Likely secondary to upper GI bleed.  Patient is doing well now.  Monitor.   Chronic left-sided neck pain: Avoiding NSAIDs.  DVT prophylaxis: SCDs Start: 07/15/22 0025   Code Status: Full Code  Family Communication:  None present at bedside.  Plan of care discussed with patient in length and he/she verbalized understanding and agreed with it.  Status is: Inpatient Remains inpatient appropriate because: Needs observation and 24 hours of PPI IV.   Estimated body mass index is 35.43 kg/m as calculated from the following:   Height as of this encounter: 5\' 1"  (1.549 m).   Weight as of this encounter: 85 kg.  Nutritional Assessment: Body mass index is 35.43 kg/m.Marland Kitchen Seen by dietician.  I agree with the assessment and plan as outlined below: Nutrition Status:        . Skin Assessment: I have examined the patient's skin and I agree with the wound assessment as performed by the wound care RN as outlined below:    Consultants:  GI  Procedures:  EGD  Antimicrobials:  Anti-infectives (From admission, onward)    None         Subjective: Seen and examined.  Feels better.  No complaints.  Tells me that she has been taking Goody powder for very long  time.  Objective: Vitals:   07/15/22 1106 07/15/22 1142 07/15/22 1147 07/15/22 1152  BP: 115/72 96/63 119/65 118/78  Pulse: (!) 56 68 67 (!) 59  Resp: 16 17 15 16   Temp: 98 F (36.7 C) 97.8 F (36.6 C)    TempSrc: Temporal Temporal    SpO2: 99% 96% 97% 99%  Weight:      Height:        Intake/Output Summary (Last 24 hours) at 07/15/2022 1159 Last data filed at 07/15/2022 1135 Gross per 24 hour  Intake 1150 ml  Output --  Net 1150 ml   Filed Weights   07/14/22 1908 07/15/22 0048  Weight: 83.5 kg 85 kg    Examination:  General exam: Appears calm and comfortable  Respiratory system: Clear to auscultation. Respiratory effort normal. Cardiovascular system: S1 & S2 heard, RRR. No JVD, murmurs, rubs, gallops or clicks. No pedal edema. Gastrointestinal system: Abdomen is nondistended, soft and mild epigastric tenderness. No organomegaly or masses felt. Normal bowel sounds heard. Central nervous system: Alert and oriented. No focal neurological deficits. Extremities: Symmetric 5 x 5 power. Skin: No rashes, lesions or ulcers Psychiatry: Judgement and insight appear normal. Mood & affect appropriate.    Data Reviewed: I have personally reviewed following labs and imaging studies  CBC: Recent Labs  Lab 07/14/22 2045 07/15/22 0327  WBC 12.0* 9.0  HGB 10.7* 9.8*  HCT 33.6* 30.3*  MCV 93.3 91.0  PLT 252 235   Basic Metabolic Panel: Recent Labs  Lab 07/14/22 2045 07/15/22 0327  NA 139 136  K 4.1 3.6  CL 107 107  CO2 23 21*  GLUCOSE 106* 90  BUN 47* 34*  CREATININE 0.85 0.65  CALCIUM 8.8* 8.3*   GFR: Estimated Creatinine Clearance: 86 mL/min (by C-G formula based on SCr of 0.65 mg/dL). Liver Function Tests: Recent Labs  Lab 07/14/22 2045  AST 15  ALT 19  ALKPHOS 51  BILITOT 0.6  PROT 5.7*  ALBUMIN 3.3*   No results for input(s): "LIPASE", "AMYLASE" in the last 168 hours. No results for input(s): "AMMONIA" in the last 168 hours. Coagulation Profile: No  results for input(s): "INR", "PROTIME" in the last 168 hours. Cardiac Enzymes: No results for input(s): "CKTOTAL", "CKMB", "CKMBINDEX", "TROPONINI" in the last 168 hours. BNP (last 3 results) No results for input(s): "PROBNP" in the last 8760 hours. HbA1C: No results for input(s): "HGBA1C" in the last 72 hours. CBG: Recent Labs  Lab 07/14/22 1928  GLUCAP 88   Lipid Profile: No results for input(s): "CHOL", "HDL", "LDLCALC", "TRIG", "CHOLHDL", "LDLDIRECT" in the last 72 hours. Thyroid Function Tests: No results for input(s): "TSH", "T4TOTAL", "FREET4", "T3FREE", "THYROIDAB" in the last 72 hours. Anemia Panel: No results for input(s): "VITAMINB12", "FOLATE", "FERRITIN", "TIBC", "IRON", "RETICCTPCT" in the last 72 hours. Sepsis Labs: No results for input(s): "PROCALCITON", "LATICACIDVEN" in the last  168 hours.  No results found for this or any previous visit (from the past 240 hour(s)).   Radiology Studies: DG CHEST PORT 1 VIEW  Result Date: 07/14/2022 CLINICAL DATA:  Syncope EXAM: PORTABLE CHEST 1 VIEW COMPARISON:  Chest x-ray 10/14/2010 FINDINGS: The heart size and mediastinal contours are within normal limits. Both lungs are clear. The visualized skeletal structures are unremarkable. IMPRESSION: No active disease. Electronically Signed   By: Darliss Cheney M.D.   On: 07/14/2022 20:36    Scheduled Meds:  acetaminophen  1,000 mg Oral Once   [MAR Hold] sodium chloride flush  3 mL Intravenous Q12H   Continuous Infusions:  sodium chloride     lactated ringers 50 mL/hr at 07/15/22 1115   pantoprazole       LOS: 0 days   Hughie Closs, MD [facility]  07/15/2022, 11:59 AM   *Please note that this is a verbal dictation therefore any spelling or grammatical errors are due to the "[employer] One" system interpretation.  Please page via Amion and do not message via secure chat for urgent patient care matters. Secure chat can be used for non urgent patient care  matters.  How to contact the [facility] Attending or Consulting provider 7A - 7P or covering provider during after hours 7P -7A, for this patient?  Check the care team in [facility] and look for a) attending/consulting [facility] provider listed and b) the [facility] team listed. Page or secure chat 7A-7P. Log into [email or web address] and use [place] universal password to access. If you do not have the password, please contact the hospital operator. Locate the [facility] provider you are looking for under [facility] and page to a number that you can be directly reached. If you still have difficulty reaching the provider, please page the [facility] (Director on Call) for the Hospitalists listed on amion for assistance.

## 2022-07-16 LAB — CBC WITH DIFFERENTIAL/PLATELET
Abs Immature Granulocytes: 0.01 10*3/uL (ref 0.00–0.07)
Basophils Absolute: 0 10*3/uL (ref 0.0–0.1)
Basophils Relative: 1 %
Eosinophils Absolute: 0.1 10*3/uL (ref 0.0–0.5)
Eosinophils Relative: 2 %
HCT: 29.1 % — ABNORMAL LOW (ref 36.0–46.0)
Hemoglobin: 9.5 g/dL — ABNORMAL LOW (ref 12.0–15.0)
Immature Granulocytes: 0 %
Lymphocytes Relative: 60 %
Lymphs Abs: 3.7 10*3/uL (ref 0.7–4.0)
MCH: 30.2 pg (ref 26.0–34.0)
MCHC: 32.6 g/dL (ref 30.0–36.0)
MCV: 92.4 fL (ref 80.0–100.0)
Monocytes Absolute: 0.3 10*3/uL (ref 0.1–1.0)
Monocytes Relative: 5 %
Neutro Abs: 2 10*3/uL (ref 1.7–7.7)
Neutrophils Relative %: 32 %
Platelets: 222 10*3/uL (ref 150–400)
RBC: 3.15 MIL/uL — ABNORMAL LOW (ref 3.87–5.11)
RDW: 13.1 % (ref 11.5–15.5)
WBC: 6.1 10*3/uL (ref 4.0–10.5)
nRBC: 0 % (ref 0.0–0.2)

## 2022-07-16 LAB — CBC
HCT: 32.1 % — ABNORMAL LOW (ref 36.0–46.0)
Hemoglobin: 10.2 g/dL — ABNORMAL LOW (ref 12.0–15.0)
MCH: 29.3 pg (ref 26.0–34.0)
MCHC: 31.8 g/dL (ref 30.0–36.0)
MCV: 92.2 fL (ref 80.0–100.0)
Platelets: 265 10*3/uL (ref 150–400)
RBC: 3.48 MIL/uL — ABNORMAL LOW (ref 3.87–5.11)
RDW: 13.1 % (ref 11.5–15.5)
WBC: 7.4 10*3/uL (ref 4.0–10.5)
nRBC: 0 % (ref 0.0–0.2)

## 2022-07-16 MED ORDER — SODIUM CHLORIDE 0.9 % IV SOLN
125.0000 mg | Freq: Once | INTRAVENOUS | Status: AC
  Administered 2022-07-16: 125 mg via INTRAVENOUS
  Filled 2022-07-16: qty 10

## 2022-07-16 NOTE — Progress Notes (Signed)
PROGRESS NOTE    Megan Melton  YQI:[id number] DOB: Nov 29, 1975 DOA: 07/14/2022 PCP: Lonie Peak, PA-C   Brief Narrative:  HPI: Megan Melton is a 47 y.o. female with medical history significant for chronic left-sided neck pain who presented to the ED for evaluation after a near syncopal episode.   Patient was at a birthday party earlier today (5/11).  She suddenly became lightheaded and dizzy and broke out in sweats.  She went to the bathroom and had a normal-appearing bowel movement.  She felt a little bit better.  Afterwards she became lightheaded again.  She was diaphoretic.  She felt as if she was going to pass out.  She also felt pain along her left neck down to her chest.     EMS were called.  During their evaluation she had the urge to have another bowel movement.  She says this time this was a large foul-smelling dark black loose bowel movement.  She did not see any red blood.  She had some aching upper abdominal pain which she describes as a "sour" feeling.  She has had nausea but no vomiting.   Patient states that she had one alcoholic beverage at the party but otherwise denies significant regular alcohol use.   She does state that she takes BC/Goody powders twice a day for her neck pain as well as occasional Advil or Aleve.  She has not had any similar episodes in the past.   ED Course  Labs/Imaging on admission: I have personally reviewed following labs and imaging studies.   Initial vitals showed BP 89/61, pulse 76, RR 16, temp 97.9 F, SpO2 99% on room air.  BP improved to 122/78 with IV fluids.   Labs show hemoglobin 10.7, platelets 252,000, WBC 12.0, sodium 139, potassium 4.1, bicarb 23, BUN 47, creatinine 0.85, serum glucose 106, LFTs within normal limits, troponin negative x 2.  I-STAT beta-hCG 5.9.   FOBT is positive.  UA shows negative nitrites, moderate leukocytes, 0-5 RBCs, 6-10 WBCs, rare bacteria on microscopy.   Portable chest x-ray negative for focal  consolidation, edema, effusion.   Patient was given 1 L LR and IV Protonix 40 mg.  The hospitalist service was consulted to admit for further evaluation and management.  Assessment & Plan:   Principal Problem:   Acute upper GI bleed Active Problems:   Near syncope   Melena   ABLA (acute blood loss anemia)   Duodenal ulcer hemorrhagic   Pre-syncope  Acute blood loss anemia secondary to upper GI bleed/oozing duodenal ulcer: GI on board, s/p EGD 07/15/2022, found to have oozing duodenal ulcer, injected with epinephrine and purastate gel.  Patient doing well.  No complaints.  Hemoglobin is stable.  Seen by GI.  They are concerned about recurrent bleeding and recommends observing another day with continuous PPI.  Patient appears frustrated with this decision but she is okay for now.  CBC for later today and tomorrow morning.  Diet placed by GI and she will be n.p.o. from midnight in preparation for potential EGD need tomorrow.  Near syncope: Likely secondary to upper GI bleed.  Patient is doing well now.  Monitor.   Chronic left-sided neck pain: Avoiding NSAIDs.  DVT prophylaxis: SCDs Start: 07/15/22 0025   Code Status: Full Code  Family Communication:  None present at bedside.  Plan of care discussed with patient in length and he/she verbalized understanding and agreed with it.  Status is: Inpatient Remains inpatient appropriate because: Needs observation and 24  hours of PPI IV.   Estimated body mass index is 35.43 kg/m as calculated from the following:   Height as of this encounter: 5\' 1"  (1.549 m).   Weight as of this encounter: 85 kg.    Nutritional Assessment: Body mass index is 35.43 kg/m.Marland Kitchen Seen by dietician.  I agree with the assessment and plan as outlined below: Nutrition Status:        . Skin Assessment: I have examined the patient's skin and I agree with the wound assessment as performed by the wound care RN as outlined below:    Consultants:   GI  Procedures:  EGD  Antimicrobials:  Anti-infectives (From admission, onward)    None         Subjective: Patient seen and examined.  No complaints.  Objective: Vitals:   07/15/22 1506 07/15/22 2044 07/16/22 0448 07/16/22 0824  BP: 112/70 112/63 (!) 96/57 107/62  Pulse: (!) 51 (!) 59  (!) 58  Resp: 16 17 17 18   Temp: 98.3 F (36.8 C) 98.4 F (36.9 C) 97.8 F (36.6 C) 98.4 F (36.9 C)  TempSrc: Oral Oral Oral Oral  SpO2: 100% 99% 98% 98%  Weight:      Height:        Intake/Output Summary (Last 24 hours) at 07/16/2022 0920 Last data filed at 07/16/2022 0900 Gross per 24 hour  Intake 1537.66 ml  Output --  Net 1537.66 ml    Filed Weights   07/14/22 1908 07/15/22 0048  Weight: 83.5 kg 85 kg    Examination:  General exam: Appears calm and comfortable  Respiratory system: Clear to auscultation. Respiratory effort normal. Cardiovascular system: S1 & S2 heard, RRR. No JVD, murmurs, rubs, gallops or clicks. No pedal edema. Gastrointestinal system: Abdomen is nondistended, soft and nontender. No organomegaly or masses felt. Normal bowel sounds heard. Central nervous system: Alert and oriented. No focal neurological deficits. Extremities: Symmetric 5 x 5 power. Skin: No rashes, lesions or ulcers.  Psychiatry: Judgement and insight appear normal. Mood & affect appropriate.   Data Reviewed: I have personally reviewed following labs and imaging studies  CBC: Recent Labs  Lab 07/14/22 2045 07/15/22 0327 07/15/22 1319 07/16/22 0218  WBC 12.0* 9.0 7.6 6.1  NEUTROABS  --   --  3.6 2.0  HGB 10.7* 9.8* 10.1* 9.5*  HCT 33.6* 30.3* 30.9* 29.1*  MCV 93.3 91.0 91.4 92.4  PLT 252 235 177 222    Basic Metabolic Panel: Recent Labs  Lab 07/14/22 2045 07/15/22 0327  NA 139 136  K 4.1 3.6  CL 107 107  CO2 23 21*  GLUCOSE 106* 90  BUN 47* 34*  CREATININE 0.85 0.65  CALCIUM 8.8* 8.3*    GFR: Estimated Creatinine Clearance: 85.1 mL/min (by C-G formula based  on SCr of 0.65 mg/dL). Liver Function Tests: Recent Labs  Lab 07/14/22 2045  AST 15  ALT 19  ALKPHOS 51  BILITOT 0.6  PROT 5.7*  ALBUMIN 3.3*    No results for input(s): "LIPASE", "AMYLASE" in the last 168 hours. No results for input(s): "AMMONIA" in the last 168 hours. Coagulation Profile: No results for input(s): "INR", "PROTIME" in the last 168 hours. Cardiac Enzymes: No results for input(s): "CKTOTAL", "CKMB", "CKMBINDEX", "TROPONINI" in the last 168 hours. BNP (last 3 results) No results for input(s): "PROBNP" in the last 8760 hours. HbA1C: No results for input(s): "HGBA1C" in the last 72 hours. CBG: Recent Labs  Lab 07/14/22 1928  GLUCAP 88    Lipid Profile:  No results for input(s): "CHOL", "HDL", "LDLCALC", "TRIG", "CHOLHDL", "LDLDIRECT" in the last 72 hours. Thyroid Function Tests: No results for input(s): "TSH", "T4TOTAL", "FREET4", "T3FREE", "THYROIDAB" in the last 72 hours. Anemia Panel: No results for input(s): "VITAMINB12", "FOLATE", "FERRITIN", "TIBC", "IRON", "RETICCTPCT" in the last 72 hours. Sepsis Labs: No results for input(s): "PROCALCITON", "LATICACIDVEN" in the last 168 hours.  No results found for this or any previous visit (from the past 240 hour(s)).   Radiology Studies: DG CHEST PORT 1 VIEW  Result Date: 07/14/2022 CLINICAL DATA:  Syncope EXAM: PORTABLE CHEST 1 VIEW COMPARISON:  Chest x-ray 10/14/2010 FINDINGS: The heart size and mediastinal contours are within normal limits. Both lungs are clear. The visualized skeletal structures are unremarkable. IMPRESSION: No active disease. Electronically Signed   By: Darliss Cheney M.D.   On: 07/14/2022 20:36    Scheduled Meds:  sodium chloride flush  3 mL Intravenous Q12H   Continuous Infusions:  ferric gluconate (FERRLECIT) IVPB 125 mg (07/16/22 0915)   pantoprazole 8 mg/hr (07/16/22 0834)     LOS: 1 day   Hughie Closs, MD [facility]  07/16/2022, 9:20 AM   *Please note that this is a  verbal dictation therefore any spelling or grammatical errors are due to the "Dragon Medical One" system interpretation.  Please page via Amion and do not message via secure chat for urgent patient care matters. Secure chat can be used for non urgent patient care matters.  How to contact the [facility] Attending or Consulting provider 7A - 7P or covering provider during after hours 7P -7A, for this patient?  Check the care team in [facility] and look for a) attending/consulting TRH provider listed and b) the [facility] The team listed. Page or secure chat 7A-7P. Log into [email or web address] and use [facility] universal password to access. If you do not have the password, please contact the hospital operator. Locate the [facility] provider you are looking for under [facility] and page to a number that you can be directly reached. If you still have difficulty reaching the provider, please page the [facility] (Director on Call) for the Hospitalists listed on amion for assistance.

## 2022-07-16 NOTE — Progress Notes (Signed)
[facility] Progress Note  Chief Complaint: Duodenal ulcer with bleeding  History:  47 year old woman admitted over the weekend for acute GI bleeding with anemia.  Upper endoscopy with Dr. Rhea Belton yesterday revealed bleeding ulcer at the junction of duodenal bulb and sweep.  Treated with submucosal epinephrine injection and application of hemostatic gel.  Good result was achieved based on report and photographs.  (Difficult morphology and location of ulcer, apparently precluding clip placement.  She has no current abdominal pain, is hungry, denies vomiting or dysphagia.  No further passage of black stool since admission.  ROS: Cardiovascular: Denies chest pain Respiratory: Denies dyspnea Urinary: Denies dysuria  Objective:   Current Facility-Administered Medications:    acetaminophen (TYLENOL) tablet 650 mg, 650 mg, Oral, Q6H PRN, 650 mg at 07/15/22 2040 **OR** acetaminophen (TYLENOL) suppository 650 mg, 650 mg, Rectal, Q6H PRN, Allena Katz, Vishal R, MD   ondansetron (ZOFRAN) tablet 4 mg, 4 mg, Oral, Q6H PRN **OR** ondansetron (ZOFRAN) injection 4 mg, 4 mg, Intravenous, Q6H PRN, Allena Katz, Vishal R, MD   pantoprozole (PROTONIX) 80 mg /NS 100 mL infusion, 8 mg/hr, Intravenous, Continuous, Pyrtle, Carie Caddy, MD, Last Rate: 10 mL/hr at 07/15/22 2229, 8 mg/hr at 07/15/22 2229   sodium chloride flush (NS) 0.9 % injection 3 mL, 3 mL, Intravenous, Q12H, Darreld Mclean R, MD, 3 mL at 07/15/22 0922   pantoprazole 8 mg/hr (07/15/22 2229)     Vital signs in last 24 hrs: Vitals:   07/16/22 0448 07/16/22 0824  BP: (!) 96/57 107/62  Pulse:  (!) 58  Resp: 17 18  Temp: 97.8 F (36.6 C) 98.4 F (36.9 C)  SpO2: 98% 98%    Intake/Output Summary (Last 24 hours) at 07/16/2022 1610 Last data filed at 07/16/2022 0500 Gross per 24 hour  Intake 1557.66 ml  Output --  Net 1557.66 ml     Physical Exam Laying comfortably in bed, just had vital signs taken. HEENT: sclera anicteric, oral mucosa without  lesions Neck: supple, no thyromegaly, JVD or lymphadenopathy Cardiac: RRR without murmurs, S1S2 heard, no peripheral edema Pulm: clear to auscultation bilaterally, normal RR and effort noted Abdomen: soft, no tenderness, with active bowel sounds. No guarding or palpable hepatosplenomegaly Skin; warm and dry, no jaundice  Recent Labs:     Latest Ref Rng & Units 07/16/2022    2:18 AM 07/15/2022    1:19 PM 07/15/2022    3:27 AM  CBC  WBC 4.0 - 10.5 K/uL 6.1  7.6  9.0   Hemoglobin 12.0 - 15.0 g/dL 9.5  96.0  9.8   Hematocrit 36.0 - 46.0 % 29.1  30.9  30.3   Platelets 150 - 400 K/uL 222  177  235     No results for input(s): "INR" in the last 168 hours.    Latest Ref Rng & Units 07/15/2022    3:27 AM 07/14/2022    8:45 PM 03/02/2016   10:31 AM  CMP  Glucose 70 - 99 mg/dL 90  454  85   BUN 6 - 20 mg/dL 34  47  15   Creatinine 0.44 - 1.00 mg/dL 0.98  1.19  1.47   Sodium 135 - 145 mmol/L 136  139  138   Potassium 3.5 - 5.1 mmol/L 3.6  4.1  4.1   Chloride 98 - 111 mmol/L 107  107  107   CO2 22 - 32 mmol/L 21  23  21    Calcium 8.9 - 10.3 mg/dL 8.3  8.8  9.3  Total Protein 6.5 - 8.1 g/dL  5.7  6.8   Total Bilirubin 0.3 - 1.2 mg/dL  0.6  0.7   Alkaline Phos 38 - 126 U/L  51  69   AST 15 - 41 U/L  15  17   ALT 0 - 44 U/L  19  19      Radiologic studies:   Assessment & Plan  Assessment: Duodenal ulcer with bleeding Acute blood loss anemia  Ulcer believed to be NSAID related.  Suspect the bleeding has stopped, but she needs another day of inpatient observation.  The treatments that were applied may wear off shortly and bleeding could recur.    Plan: Low residue diet today Clear liquid diet starting at midnight to give [place] the option of an upper endoscopy tomorrow if there is any concern for recurrent bleeding CBC tonight and tomorrow morning This patient would benefit from a dose of IV iron prior to discharge.   Charlie Pitter III Office: [phone]

## 2022-07-16 NOTE — Progress Notes (Signed)
  Transition of Care [facility]) Screening Note   Patient Details  Name: ADELAYDE BESTOR Date of Birth: June 08, 1975   Transition of Care [facility]) CM/SW Contact:    Tom-Johnson, Hershal Coria, RN Phone Number: 07/16/2022, 3:06 PM  Transition of Care Department [facility]) has reviewed patient and no TOC needs or recommendations have been identified at this time. TOC will continue to monitor patient advancement through interdisciplinary progression rounds. If new patient transition needs arise, please place a TOC consult.

## 2022-07-16 NOTE — Anesthesia Postprocedure Evaluation (Signed)
Anesthesia Post Note  Patient: Megan Melton  Procedure(s) Performed: ESOPHAGOGASTRODUODENOSCOPY (EGD) WITH PROPOFOL SUBMUCOSAL INJECTION     Patient location during evaluation: Endoscopy Anesthesia Type: MAC Level of consciousness: oriented, awake and alert and awake Pain management: pain level controlled Vital Signs Assessment: post-procedure vital signs reviewed and stable Respiratory status: spontaneous breathing, nonlabored ventilation, respiratory function stable and patient connected to nasal cannula oxygen Cardiovascular status: blood pressure returned to baseline and stable Postop Assessment: no headache, no backache and no apparent nausea or vomiting Anesthetic complications: no   No notable events documented.  Last Vitals:  Vitals:   07/16/22 0448 07/16/22 0824  BP: (!) 96/57 107/62  Pulse:  (!) 58  Resp: 17 18  Temp: 36.6 C 36.9 C  SpO2: 98% 98%    Last Pain:  Vitals:   07/16/22 1015  TempSrc:   PainSc: 0-No pain                 Collene Schlichter

## 2022-07-17 ENCOUNTER — Encounter (HOSPITAL_COMMUNITY): Payer: Self-pay | Admitting: Internal Medicine

## 2022-07-17 ENCOUNTER — Other Ambulatory Visit (HOSPITAL_COMMUNITY): Payer: Self-pay

## 2022-07-17 DIAGNOSIS — K5900 Constipation, unspecified: Secondary | ICD-10-CM

## 2022-07-17 LAB — CBC WITH DIFFERENTIAL/PLATELET
Abs Immature Granulocytes: 0.02 10*3/uL (ref 0.00–0.07)
Basophils Absolute: 0.1 10*3/uL (ref 0.0–0.1)
Basophils Relative: 1 %
Eosinophils Absolute: 0.1 10*3/uL (ref 0.0–0.5)
Eosinophils Relative: 2 %
HCT: 28.4 % — ABNORMAL LOW (ref 36.0–46.0)
Hemoglobin: 9.2 g/dL — ABNORMAL LOW (ref 12.0–15.0)
Immature Granulocytes: 0 %
Lymphocytes Relative: 43 %
Lymphs Abs: 3.2 10*3/uL (ref 0.7–4.0)
MCH: 29.6 pg (ref 26.0–34.0)
MCHC: 32.4 g/dL (ref 30.0–36.0)
MCV: 91.3 fL (ref 80.0–100.0)
Monocytes Absolute: 0.5 10*3/uL (ref 0.1–1.0)
Monocytes Relative: 7 %
Neutro Abs: 3.6 10*3/uL (ref 1.7–7.7)
Neutrophils Relative %: 47 %
Platelets: 235 10*3/uL (ref 150–400)
RBC: 3.11 MIL/uL — ABNORMAL LOW (ref 3.87–5.11)
RDW: 13 % (ref 11.5–15.5)
WBC: 7.6 10*3/uL (ref 4.0–10.5)
nRBC: 0 % (ref 0.0–0.2)

## 2022-07-17 MED ORDER — FERROUS SULFATE 325 (65 FE) MG PO TABS
325.0000 mg | ORAL_TABLET | Freq: Every day | ORAL | 0 refills | Status: AC
  Filled 2022-07-17: qty 100, 100d supply, fill #0

## 2022-07-17 MED ORDER — OMEPRAZOLE 40 MG PO CPDR
40.0000 mg | DELAYED_RELEASE_CAPSULE | Freq: Two times a day (BID) | ORAL | 1 refills | Status: DC
  Filled 2022-07-17: qty 60, 30d supply, fill #0

## 2022-07-17 NOTE — Discharge Summary (Signed)
Physician Discharge Summary  Megan Melton ZOX:[id number] DOB: [birth year] DOA: 07/14/2022  PCP: Lonie Peak, PA-C  Admit date: 07/14/2022 Discharge date: 07/17/2022 30 Day Unplanned Readmission Risk Score    Flowsheet Row ED to Hosp-Admission (Current) from 07/14/2022 in [facility] 31M KIDNEY UNIT  30 Day Unplanned Readmission Risk Score (%) 10.47 Filed at 07/17/2022 0801       This score is the patient's risk of an unplanned readmission within 30 days of being discharged (0 -100%). The score is based on dignosis, age, lab data, medications, orders, and past utilization.   Low:  0-14.9   Medium: 15-21.9   High: 22-29.9   Extreme: 30 and above          Admitted From: Home Disposition: Home  Recommendations for Outpatient Follow-up:  Follow up with PCP in 1-2 weeks Please obtain BMP/CBC in one week Follow-up with GI in 1 week, they will arrange. Please follow up with your PCP on the following pending results: Unresulted Labs (From admission, onward)     Start     Ordered   07/15/22 1203  CBC with Differential/Platelet  Daily,   R      07/15/22 1202              Home Health: None Equipment/Devices: None  Discharge Condition: Stable CODE STATUS: Full code Diet recommendation: Regular  Subjective: Seen and examined.  No complaints.  Tolerating regular diet.  She is excited to go home.  Brief/Interim Summary: Megan Melton is a 47 y.o. female with medical history significant for chronic left-sided neck pain and long-term NSAID use who presented to the ED for evaluation after a near syncopal episode.   Patient was at a birthday party earlier today (5/11).  She suddenly became lightheaded and dizzy and broke out in sweats.  She went to the bathroom and had a normal-appearing bowel movement.  She felt a little bit better.  Afterwards she became lightheaded again.  She was diaphoretic.  She felt as if she was going to pass out.  EMS were called.  During their evaluation  she had the urge to have another bowel movement.  She says this time this was a large foul-smelling dark black loose bowel movement.  She did not see any red blood.  She had some aching upper abdominal pain, She has had nausea but no vomiting. She does state that she takes BC/Goody powders twice a day for her neck pain as well as occasional Advil or Aleve.  She has not had any similar episodes in the past.  Upon arrival to ED, Initial vitals showed BP 89/61, pulse 76, RR 16, temp 97.9 F, SpO2 99% on room air.  BP improved to 122/78 with IV fluids. Labs show hemoglobin 10.7. FOBT is positive.  Chest x-ray negative.  Patient was admitted under hospital service due to Acute blood loss anemia secondary to upper GI bleed/oozing duodenal ulcer GI consulted, s/p EGD 07/15/2022, found to have oozing duodenal ulcer, injected with epinephrine and purastate gel.  Patient doing well.  No complaints.  Hemoglobin is stable.  Per GI recommendation, she was observed for 48 hours.  Hemoglobin has remained fairly stable.  No further melena or hematochezia or hematemesis.  She remained on PPI for the whole duration of hospitalization.  GI has now cleared her for discharge.  They recommend discharging on PPI twice daily for 8 weeks.  She will follow-up with PCP as well as GI in 1 week and repeat  CBC.  She has been advised to avoid NSAIDs and she verbalized understanding.   Near syncope: Likely secondary to upper GI bleed.  Patient is doing well now.     Chronic left-sided neck pain: Avoid NSAIDs.  Discharge plan was discussed with patient and/or family member and they verbalized understanding and agreed with it.  Discharge Diagnoses:  Principal Problem:   Acute upper GI bleed Active Problems:   Near syncope   Melena   ABLA (acute blood loss anemia)   Duodenal ulcer hemorrhagic   Pre-syncope    Discharge Instructions   Allergies as of 07/17/2022       Reactions   Penicillins    convulsions         Medication List     TAKE these medications    BIOTIN PO Take 1 tablet by mouth daily.   cholecalciferol 25 MCG (1000 UNIT) tablet Commonly known as: VITAMIN D3 Take 1,000 Units by mouth daily.   ferrous sulfate 325 (65 FE) MG tablet Take 1 tablet (325 mg total) by mouth daily with breakfast.   omeprazole 40 MG capsule Commonly known as: PRILOSEC Take 1 capsule (40 mg total) by mouth 2 (two) times daily.   phentermine 30 MG capsule Take 30 mg by mouth every morning.   VITAMIN B-12 PO Take 1 tablet by mouth daily.   zolpidem 5 MG tablet Commonly known as: AMBIEN Take 5 mg by mouth at bedtime as needed for sleep.        Follow-up Information     Lonie Peak, PA-C Follow up in 1 week(s).   Specialty: Physician Assistant Contact information: [address] [place] [address] [phone]                Allergies  Allergen Reactions   Penicillins     convulsions    Consultations: GI   Procedures/Studies: DG CHEST PORT 1 VIEW  Result Date: 07/14/2022 CLINICAL DATA:  Syncope EXAM: PORTABLE CHEST 1 VIEW COMPARISON:  Chest x-ray 10/14/2010 FINDINGS: The heart size and mediastinal contours are within normal limits. Both lungs are clear. The visualized skeletal structures are unremarkable. IMPRESSION: No active disease. Electronically Signed   By: Darliss Cheney M.D.   On: 07/14/2022 20:36     Discharge Exam: Vitals:   07/16/22 1520 07/16/22 2150  BP: 114/71 (!) 106/58  Pulse: (!) 57 70  Resp: 17 18  Temp: 98.2 F (36.8 C) 98.8 F (37.1 C)  SpO2: 100% 95%   Vitals:   07/16/22 0448 07/16/22 0824 07/16/22 1520 07/16/22 2150  BP: (!) 96/57 107/62 114/71 (!) 106/58  Pulse:  (!) 58 (!) 57 70  Resp: 17 18 17 18   Temp: 97.8 F (36.6 C) 98.4 F (36.9 C) 98.2 F (36.8 C) 98.8 F (37.1 C)  TempSrc: Oral Oral Oral Oral  SpO2: 98% 98% 100% 95%  Weight:      Height:        General: Pt is alert, awake, not in acute distress Cardiovascular:  RRR, S1/S2 +, no rubs, no gallops Respiratory: CTA bilaterally, no wheezing, no rhonchi Abdominal: Soft, NT, ND, bowel sounds + Extremities: no edema, no cyanosis    The results of significant diagnostics from this hospitalization (including imaging, microbiology, ancillary and laboratory) are listed below for reference.     Microbiology: No results found for this or any previous visit (from the past 240 hour(s)).   Labs: BNP (last 3 results) No results for input(s): "BNP" in the last 8760  hours. Basic Metabolic Panel: Recent Labs  Lab 07/14/22 2045 07/15/22 0327  NA 139 136  K 4.1 3.6  CL 107 107  CO2 23 21*  GLUCOSE 106* 90  BUN 47* 34*  CREATININE 0.85 0.65  CALCIUM 8.8* 8.3*   Liver Function Tests: Recent Labs  Lab 07/14/22 2045  AST 15  ALT 19  ALKPHOS 51  BILITOT 0.6  PROT 5.7*  ALBUMIN 3.3*   No results for input(s): "LIPASE", "AMYLASE" in the last 168 hours. No results for input(s): "AMMONIA" in the last 168 hours. CBC: Recent Labs  Lab 07/15/22 0327 07/15/22 1319 07/16/22 0218 07/16/22 1529 07/17/22 0324  WBC 9.0 7.6 6.1 7.4 7.6  NEUTROABS  --  3.6 2.0  --  3.6  HGB 9.8* 10.1* 9.5* 10.2* 9.2*  HCT 30.3* 30.9* 29.1* 32.1* 28.4*  MCV 91.0 91.4 92.4 92.2 91.3  PLT 235 177 222 265 235   Cardiac Enzymes: No results for input(s): "CKTOTAL", "CKMB", "CKMBINDEX", "TROPONINI" in the last 168 hours. BNP: Invalid input(s): "POCBNP" CBG: Recent Labs  Lab 07/14/22 1928  GLUCAP 88   D-Dimer No results for input(s): "DDIMER" in the last 72 hours. Hgb A1c No results for input(s): "HGBA1C" in the last 72 hours. Lipid Profile No results for input(s): "CHOL", "HDL", "LDLCALC", "TRIG", "CHOLHDL", "LDLDIRECT" in the last 72 hours. Thyroid function studies No results for input(s): "TSH", "T4TOTAL", "T3FREE", "THYROIDAB" in the last 72 hours.  Invalid input(s): "FREET3" Anemia work up No results for input(s): "VITAMINB12", "FOLATE", "FERRITIN", "TIBC",  "IRON", "RETICCTPCT" in the last 72 hours. Urinalysis    Component Value Date/Time   COLORURINE STRAW (A) 07/14/2022 2217   APPEARANCEUR CLEAR 07/14/2022 2217   LABSPEC 1.017 07/14/2022 2217   PHURINE 5.0 07/14/2022 2217   GLUCOSEU NEGATIVE 07/14/2022 2217   HGBUR NEGATIVE 07/14/2022 2217   BILIRUBINUR NEGATIVE 07/14/2022 2217   KETONESUR 5 (A) 07/14/2022 2217   PROTEINUR NEGATIVE 07/14/2022 2217   NITRITE NEGATIVE 07/14/2022 2217   LEUKOCYTESUR MODERATE (A) 07/14/2022 2217   Sepsis Labs Recent Labs  Lab 07/15/22 1319 07/16/22 0218 07/16/22 1529 07/17/22 0324  WBC 7.6 6.1 7.4 7.6   Microbiology No results found for this or any previous visit (from the past 240 hour(s)).   Time coordinating discharge: Over 30 minutes  SIGNED:   Hughie Closs, MD  [facility] 07/17/2022, 9:20 AM *Please note that this is a verbal dictation therefore any spelling or grammatical errors are due to the "Dragon Medical One" system interpretation. If 7PM-7AM, please contact night-coverage [email or web address]

## 2022-07-17 NOTE — TOC Transition Note (Signed)
Transition of Care [facility]) - CM/SW Discharge Note   Patient Details  Name: Megan Melton MRN: [id number] Date of Birth: 08/22/1975  Transition of Care [facility]) CM/SW Contact:  Tom-Johnson, Hershal Coria, RN Phone Number: 07/17/2022, 10:27 AM   Clinical Narrative:     Patient is scheduled for discharge today.  Readmission Risk Assessment done. Hospital f/u and discharge instructions on AVS. Prescriptions sent to [facility] pharmacy and meds will be delivered to patient at bedside prior discharge. No TOC needs or recommendations noted. Significant other, Scott to transport at discharge.  No further TOC needs noted.        Final next level of care: Home/Self Care Barriers to Discharge: Barriers Resolved   Patient Goals and CMS Choice CMS Medicare.gov Compare Post Acute Care list provided to:: Patient Choice offered to / list presented to : NA  Discharge Placement                  Patient to be transferred to facility by: Significant other Name of family member notified: Scott    Discharge Plan and Services Additional resources added to the After Visit Summary for                  DME Arranged: N/A DME Agency: NA       HH Arranged: NA HH Agency: NA        Social Determinants of Health (SDOH) Interventions SDOH Screenings   Food Insecurity: No Food Insecurity (07/15/2022)  Housing: Low Risk  (07/15/2022)  Transportation Needs: No Transportation Needs (07/15/2022)  Utilities: Not At Risk (07/15/2022)  Tobacco Use: Medium Risk (07/15/2022)     Readmission Risk Interventions    07/17/2022   10:26 AM  Readmission Risk Prevention Plan  Post Dischage Appt Complete  Medication Screening Complete  Transportation Screening Complete

## 2022-07-17 NOTE — Progress Notes (Signed)
[facility] Progress Note  Chief Complaint: Duodenal ulcer with bleed  History:  No further melena since the episode that brought her to the hospital.  Stool small and pellet-like, per usual for her.  Bloated from constipation, no epigastric pain.  No nausea or vomiting.  Tolerating medicines well, got IV iron yesterday.  ROS: Cardiovascular: No chest pain Respiratory: No dyspnea Urinary: No dysuria  Objective:   Current Facility-Administered Medications:    acetaminophen (TYLENOL) tablet 650 mg, 650 mg, Oral, Q6H PRN, 650 mg at 07/15/22 2040 **OR** acetaminophen (TYLENOL) suppository 650 mg, 650 mg, Rectal, Q6H PRN, Allena Katz, Vishal R, MD   ondansetron (ZOFRAN) tablet 4 mg, 4 mg, Oral, Q6H PRN **OR** ondansetron (ZOFRAN) injection 4 mg, 4 mg, Intravenous, Q6H PRN, Allena Katz, Vishal R, MD   pantoprozole (PROTONIX) 80 mg /NS 100 mL infusion, 8 mg/hr, Intravenous, Continuous, Pyrtle, Carie Caddy, MD, Last Rate: 10 mL/hr at 07/17/22 0720, 8 mg/hr at 07/17/22 0720   sodium chloride flush (NS) 0.9 % injection 3 mL, 3 mL, Intravenous, Q12H, Darreld Mclean R, MD, 3 mL at 07/16/22 2124   pantoprazole 8 mg/hr (07/17/22 0720)     Vital signs in last 24 hrs: Vitals:   07/16/22 1520 07/16/22 2150  BP: 114/71 (!) 106/58  Pulse: (!) 57 70  Resp: 17 18  Temp: 98.2 F (36.8 C) 98.8 F (37.1 C)  SpO2: 100% 95%    Intake/Output Summary (Last 24 hours) at 07/17/2022 0837 Last data filed at 07/16/2022 2150 Gross per 24 hour  Intake 680 ml  Output 0 ml  Net 680 ml     Physical Exam  HEENT: sclera anicteric, oral mucosa without lesions Neck: supple, no thyromegaly, JVD or lymphadenopathy Cardiac: RRR without murmurs, S1S2 heard, no peripheral edema Pulm: clear to auscultation bilaterally, normal RR and effort noted Abdomen: soft, no tenderness, with active bowel sounds. No guarding or palpable hepatosplenomegaly Skin; warm and dry, no jaundice  Recent Labs:     Latest Ref Rng & Units 07/17/2022     3:24 AM 07/16/2022    3:29 PM 07/16/2022    2:18 AM  CBC  WBC 4.0 - 10.5 K/uL 7.6  7.4  6.1   Hemoglobin 12.0 - 15.0 g/dL 9.2  03.5  9.5   Hematocrit 36.0 - 46.0 % 28.4  32.1  29.1   Platelets 150 - 400 K/uL 235  265  222     No results for input(s): "INR" in the last 168 hours.    Latest Ref Rng & Units 07/15/2022    3:27 AM 07/14/2022    8:45 PM 03/02/2016   10:31 AM  CMP  Glucose 70 - 99 mg/dL 90  009  85   BUN 6 - 20 mg/dL 34  47  15   Creatinine 0.44 - 1.00 mg/dL 3.81  8.29  9.37   Sodium 135 - 145 mmol/L 136  139  138   Potassium 3.5 - 5.1 mmol/L 3.6  4.1  4.1   Chloride 98 - 111 mmol/L 107  107  107   CO2 22 - 32 mmol/L 21  23  21    Calcium 8.9 - 10.3 mg/dL 8.3  8.8  9.3   Total Protein 6.5 - 8.1 g/dL  5.7  6.8   Total Bilirubin 0.3 - 1.2 mg/dL  0.6  0.7   Alkaline Phos 38 - 126 U/L  51  69   AST 15 - 41 U/L  15  17   ALT 0 -  44 U/L  19  19      Radiologic studies:   Assessment & Plan  Assessment:  Duodenal ulcer with bleed, status post endoscopic treatment 2 days ago  Acute blood loss anemia.  I do not think she is rebleeding at this point, there is some fluctuation in equilibration of her hemoglobin over the last 48 hours.  Excess aspirin and NSAID use, must be discontinued permanently.   Plan: Regular diet  Discharge home today on twice daily oral PPI Over-the-counter slow release 65 mg iron tablet (inform patient I will turn the stool dark  We will arrange follow-up at our office with Dr. Rhea Belton and CBC in a week  No aspirin or NSAIDs   Charlie Pitter III Office: [phone]

## 2022-07-19 ENCOUNTER — Telehealth: Payer: Self-pay

## 2022-07-19 ENCOUNTER — Other Ambulatory Visit: Payer: Self-pay

## 2022-07-19 DIAGNOSIS — K922 Gastrointestinal hemorrhage, unspecified: Secondary | ICD-10-CM

## 2022-07-19 NOTE — Telephone Encounter (Signed)
Order and reminder in epic for lab. Pt scheduled to see Dr. Rhea Belton 09/27/22 at 3:40pm. Appt letter mailed to pt.

## 2022-07-19 NOTE — Telephone Encounter (Signed)
-----   Message from Beverley Fiedler, MD sent at 07/19/2022  8:40 AM EDT ----- Regarding: FW: hospital follow up Can you pls sch Thanks JMP  ----- Message ----- From: Sherrilyn Rist, MD Sent: 07/17/2022   8:42 AM EDT To: Beverley Fiedler, MD Subject: hospital follow up                             Vonna Kotyk,    This [facility] patient needs a CBC in a week and office follow up with you.  Thx  - HD

## 2022-07-23 ENCOUNTER — Other Ambulatory Visit (HOSPITAL_COMMUNITY): Payer: Self-pay

## 2022-09-27 ENCOUNTER — Other Ambulatory Visit (INDEPENDENT_AMBULATORY_CARE_PROVIDER_SITE_OTHER): Payer: Self-pay

## 2022-09-27 ENCOUNTER — Encounter: Payer: Self-pay | Admitting: Internal Medicine

## 2022-09-27 ENCOUNTER — Ambulatory Visit (INDEPENDENT_AMBULATORY_CARE_PROVIDER_SITE_OTHER): Payer: Self-pay | Admitting: Internal Medicine

## 2022-09-27 VITALS — BP 102/64 | HR 73 | Ht 61.75 in | Wt 193.0 lb

## 2022-09-27 DIAGNOSIS — K269 Duodenal ulcer, unspecified as acute or chronic, without hemorrhage or perforation: Secondary | ICD-10-CM

## 2022-09-27 DIAGNOSIS — Z1211 Encounter for screening for malignant neoplasm of colon: Secondary | ICD-10-CM

## 2022-09-27 DIAGNOSIS — D62 Acute posthemorrhagic anemia: Secondary | ICD-10-CM

## 2022-09-27 LAB — CBC WITH DIFFERENTIAL/PLATELET
Basophils Absolute: 0 10*3/uL (ref 0.0–0.1)
Basophils Relative: 0.7 % (ref 0.0–3.0)
Eosinophils Absolute: 0.1 10*3/uL (ref 0.0–0.7)
Eosinophils Relative: 1.1 % (ref 0.0–5.0)
HCT: 34 % — ABNORMAL LOW (ref 36.0–46.0)
Hemoglobin: 11 g/dL — ABNORMAL LOW (ref 12.0–15.0)
Lymphocytes Relative: 39.1 % (ref 12.0–46.0)
Lymphs Abs: 2.7 10*3/uL (ref 0.7–4.0)
MCHC: 32.3 g/dL (ref 30.0–36.0)
MCV: 90.6 fl (ref 78.0–100.0)
Monocytes Absolute: 0.4 10*3/uL (ref 0.1–1.0)
Monocytes Relative: 5.5 % (ref 3.0–12.0)
Neutro Abs: 3.7 10*3/uL (ref 1.4–7.7)
Neutrophils Relative %: 53.6 % (ref 43.0–77.0)
Platelets: 276 10*3/uL (ref 150.0–400.0)
RBC: 3.75 Mil/uL — ABNORMAL LOW (ref 3.87–5.11)
RDW: 13.4 % (ref 11.5–15.5)
WBC: 6.9 10*3/uL (ref 4.0–10.5)

## 2022-09-27 LAB — FERRITIN: Ferritin: 12.7 ng/mL (ref 10.0–291.0)

## 2022-09-27 MED ORDER — PANTOPRAZOLE SODIUM 40 MG PO TBEC: 40.0000 mg | DELAYED_RELEASE_TABLET | Freq: Every day | ORAL | 3 refills | Status: AC

## 2022-09-27 NOTE — Patient Instructions (Signed)
Your provider has requested that you go to the basement level for lab work before leaving today. Press "B" on the elevator. The lab is located at the first door on the left as you exit the elevator.  We have sent the following medications to your pharmacy for you to pick up at your convenience: pantoprazole 40 mg daily.   Please decide if you want to proceed with either Cologuard testing or a colonoscopy. Let our office know when you decide.   _______________________________________________________  If your blood pressure at your visit was 140/90 or greater, please contact your primary care physician to follow up on this.  _______________________________________________________  If you are age 29 or older, your body mass index should be between 23-30. Your Body mass index is 35.59 kg/m. If this is out of the aforementioned range listed, please consider follow up with your Primary Care Provider.  If you are age 85 or younger, your body mass index should be between 19-25. Your Body mass index is 35.59 kg/m. If this is out of the aformentioned range listed, please consider follow up with your Primary Care Provider.   ________________________________________________________  The [facility] providers would like to encourage you to use [facility] to communicate with providers for non-urgent requests or questions.  Due to long hold times on the telephone, sending your provider a message by [facility] may be a faster and more efficient way to get a response.  Please allow 48 business hours for a response.  Please remember that this is for non-urgent requests.  _______________________________________________________

## 2022-09-27 NOTE — Progress Notes (Signed)
   Subjective:    Patient ID: Megan Melton, female    DOB: June 03, 1975, 47 y.o.   MRN: [id number]  HPI Megan Melton is a 47 year old female with a recent history of bleeding duodenal ulcer felt secondary to NSAIDs, prior cholecystectomy, and chronic neck pain who is seen for hospital follow-up.  She is here alone today.  I saw her in consult during hospitalization where she presented with presyncopal symptoms and melena.  This was on Jul 15, 2022.  EGD on the same day revealed a bleeding cratered duodenal ulcer which was treated with epinephrine injection and purastat gel application.  She was discharged with twice daily omeprazole which she took for a month.  She also took oral iron for about a month.  She stopped oral iron because stools were dark and she was worried that she would not be able to see melena.  She had a hemoglobin repeated by primary care on 07/26/2022 and it was increased to 10.8  Overall she is feeling well.  She has not seen any melenic stools.  No visible blood in stool.  She has avoided NSAIDs which has been difficult because she gets headaches.  Tylenol does not help her headache.  She does at times feel constipated.  She reports one of her biggest constraints right now as she lacks medical insurance.   Review of Systems As per HPI, otherwise negative  Current Medications, Allergies, Past Medical History, Past Surgical History, Family History and Social History were reviewed in [employer] record.    Objective:   Physical Exam BP 102/64   Pulse 73   Ht 5' 1.75" (1.568 m)   Wt 193 lb (87.5 kg)   SpO2 98%   BMI 35.59 kg/m  Gen: awake, alert, NAD HEENT: anicteric  CV: RRR, no mrg Abd: soft, NT/ND, +BS throughout Ext: no c/c/e Neuro: nonfocal       Assessment & Plan:  47 year old female with a recent history of bleeding duodenal ulcer felt secondary to NSAIDs, prior cholecystectomy, and chronic neck pain who is seen for hospital  follow-up.   Duodenal ulcer secondary to Goody powders --she has avoided NSAIDs.  She did take twice daily PPI but this medication ran out.  Given her headaches and plan for possible intermittent NSAID use I would recommend we resume at least daily PPI.  We discussed surveillance upper endoscopy but given her lack of insurance and the low risk nature for neoplastic process of duodenal ulcer this is deferred for now -- Pantoprazole 40 mg daily  2.  Anemia secondary to blood loss related to #1 --she took oral iron for several weeks.  Hemoglobin was improving by later May when she saw her primary care -- Repeat CBC and ferritin today  3.  Colon cancer screening --screening recommended.  Again her lack of insurance gives her some pause for now.  We discussed colonoscopy as well as Cologuard.  She wishes to discuss this more with her boyfriend to decide how to proceed.  She is can call back and schedule colonoscopy or request Cologuard and I am happy to order either

## 2022-10-11 ENCOUNTER — Encounter: Payer: Self-pay | Admitting: Internal Medicine

## 2022-10-18 ENCOUNTER — Other Ambulatory Visit: Payer: Self-pay | Admitting: Physician Assistant

## 2022-10-18 DIAGNOSIS — Z8249 Family history of ischemic heart disease and other diseases of the circulatory system: Secondary | ICD-10-CM

## 2022-10-22 ENCOUNTER — Other Ambulatory Visit: Payer: Self-pay | Admitting: Physician Assistant

## 2022-10-22 DIAGNOSIS — Z8249 Family history of ischemic heart disease and other diseases of the circulatory system: Secondary | ICD-10-CM

## 2022-11-07 ENCOUNTER — Ambulatory Visit
Admission: RE | Admit: 2022-11-07 | Discharge: 2022-11-07 | Disposition: A | Discharge status: Home / Self Care | Payer: No Typology Code available for payment source | Source: Ambulatory Visit | Attending: Physician Assistant | Admitting: Physician Assistant

## 2022-11-07 DIAGNOSIS — Z8249 Family history of ischemic heart disease and other diseases of the circulatory system: Secondary | ICD-10-CM

## 2022-12-17 ENCOUNTER — Ambulatory Visit (AMBULATORY_SURGERY_CENTER): Payer: Self-pay | Admitting: *Deleted

## 2022-12-17 VITALS — Ht 61.0 in | Wt 172.0 lb

## 2022-12-17 DIAGNOSIS — Z1211 Encounter for screening for malignant neoplasm of colon: Secondary | ICD-10-CM

## 2022-12-17 NOTE — Progress Notes (Signed)
Pre visit completed over telephone. Instructions forwarded through MyChart and mailed  Instructed to hold Zepbound x 7 days, Patient no longer taking Phentermine.  No egg or soy allergy known to patient  No issues known to pt with past sedation with any surgeries or procedures Patient denies ever being told they had issues or difficulty with intubation  No FH of Malignant Hyperthermia Pt is not on diet pills Pt is not on  home 02  Pt is not on blood thinners  Pt denies issues with constipation  No A fib or A flutter Have any cardiac testing pending--NO Pt instructed to use [email or web address] or GoodRx for a price reduction on prep

## 2023-01-01 ENCOUNTER — Other Ambulatory Visit: Payer: Self-pay

## 2023-01-10 ENCOUNTER — Encounter: Payer: Self-pay | Admitting: Internal Medicine
